# Patient Record
Sex: Female | Born: 1950 | Race: Black or African American | Hispanic: No | Marital: Married | Smoking: Never smoker
Health system: Southern US, Community
[De-identification: ages and names within clinical notes are randomized; demographics above are authoritative.]

## PROBLEM LIST (undated history)

## (undated) DIAGNOSIS — E119 Type 2 diabetes mellitus without complications: Secondary | ICD-10-CM

## (undated) DIAGNOSIS — D649 Anemia, unspecified: Secondary | ICD-10-CM

## (undated) DIAGNOSIS — N189 Chronic kidney disease, unspecified: Secondary | ICD-10-CM

## (undated) DIAGNOSIS — I10 Essential (primary) hypertension: Secondary | ICD-10-CM

## (undated) HISTORY — PX: EYE SURGERY: SHX253

## (undated) HISTORY — DX: Chronic kidney disease, unspecified: N18.9

## (undated) HISTORY — DX: Anemia, unspecified: D64.9

## (undated) HISTORY — DX: Type 2 diabetes mellitus without complications: E11.9

---

## 1998-12-18 ENCOUNTER — Other Ambulatory Visit: Admission: RE | Admit: 1998-12-18 | Discharge: 1998-12-18 | Payer: Self-pay | Admitting: *Deleted

## 1999-10-25 ENCOUNTER — Ambulatory Visit (HOSPITAL_COMMUNITY): Admission: RE | Admit: 1999-10-25 | Discharge: 1999-10-25 | Payer: Self-pay | Admitting: Gastroenterology

## 1999-12-31 ENCOUNTER — Other Ambulatory Visit: Admission: RE | Admit: 1999-12-31 | Discharge: 1999-12-31 | Payer: Self-pay | Admitting: *Deleted

## 2003-04-12 ENCOUNTER — Ambulatory Visit (HOSPITAL_COMMUNITY): Admission: RE | Admit: 2003-04-12 | Discharge: 2003-04-13 | Payer: Self-pay | Admitting: Ophthalmology

## 2003-07-31 ENCOUNTER — Ambulatory Visit (HOSPITAL_COMMUNITY): Admission: RE | Admit: 2003-07-31 | Discharge: 2003-07-31 | Payer: Self-pay | Admitting: Ophthalmology

## 2003-12-08 ENCOUNTER — Ambulatory Visit: Payer: Self-pay | Admitting: Oncology

## 2009-03-23 ENCOUNTER — Ambulatory Visit (HOSPITAL_COMMUNITY): Admission: RE | Admit: 2009-03-23 | Discharge: 2009-03-23 | Payer: Self-pay | Admitting: Nephrology

## 2010-04-24 LAB — GLUCOSE, CAPILLARY
Glucose-Capillary: 125 mg/dL — ABNORMAL HIGH (ref 70–99)
Glucose-Capillary: 88 mg/dL (ref 70–99)

## 2010-04-24 LAB — PROTIME-INR
INR: 0.99 (ref 0.00–1.49)
Prothrombin Time: 13 seconds (ref 11.6–15.2)

## 2010-04-24 LAB — APTT: aPTT: 34 seconds (ref 24–37)

## 2010-04-24 LAB — BASIC METABOLIC PANEL
BUN: 27 mg/dL — ABNORMAL HIGH (ref 6–23)
Sodium: 140 mEq/L (ref 135–145)

## 2010-04-24 LAB — CBC
Hemoglobin: 11.2 g/dL — ABNORMAL LOW (ref 12.0–15.0)
MCHC: 33.1 g/dL (ref 30.0–36.0)
MCV: 81.1 fL (ref 78.0–100.0)
RDW: 16.7 % — ABNORMAL HIGH (ref 11.5–15.5)

## 2010-06-21 NOTE — Procedures (Signed)
[place]. [facility]  Patient:    Hannah [name], Hannah [name]                     MRN: [id number] Proc. Date: 10/25/99 Adm. Date:  [id number] Disc. Date: [id number] Attending:  Ernie Avena CC:         Jaynie Crumble, M.D.   Procedure Report  PROCEDURE:  Colonoscopy.  INDICATIONS:  A 60 year old female with anemia, hemoglobin 9.7 and MCV 81, with negative upper endoscopy.  FINDINGS:  Normal exam.  DESCRIPTION OF PROCEDURE:  The nature, purpose, and risks of the procedure had been discussed with the patient, who provided written consent.  Sedation was fentanyl 75 mcg and Versed 4 mg IV prior to and during this procedure and the upper endoscopy which preceded.  The Olympus PCF 140L pediatric extended length video colonoscope was advanced quite easily to the cecum as identified by clear visualization of the appendiceal orifice and the absence of further lumen.  Pullback was then performed.  I could not readily identify and enter the orifice of the ileocecal valve.  The quality of the prep was very good and it was felt that all areas were well seen.  This was an unremarkable examination.  No polyps, cancer, colitis, vascular malformations, or diverticular disease were observed and retroflexion in the rectum was unremarkable.  No biopsies were obtained.  The patient tolerated this procedure well and there were no apparent complications.  IMPRESSION:  Normal colonoscopy.  No source of anemia endoscopically evident.  PLAN: 1. Check iron studies and possibly repeat hemoglobin. 2. Home Hemoccults (the patient was Hemoccult negative in the office). DD:  10/25/99 TD:  10/27/99 Job: [id number]

## 2010-06-21 NOTE — Procedures (Signed)
[place]. [facility]  Patient:    Hannah [name], Hannah [name]                     MRN: [id number] Proc. Date: 10/25/99 Adm. Date:  [id number] Attending:  Ernie Avena CC:         Jaynie Crumble, M.D., [place], [place]   Procedure Report  PROCEDURE:  Upper endoscopy.  ENDOSCOPIST:  Cleotis Nipper, M.D.  INDICATIONS:  [age]-year-old female with long-standing NSAID use who presented with anemia, hemoglobin 9.7, MCV 81.  FINDINGS:  Normal exam.  No NSAID gastropathy.  INFORMED CONSENT:  The nature, purpose and risks of the procedure had been discussed with the patient who provided written consent.  SEDATION:  Fentanyl 50 mcg and Versed 4 mg IV without arrhythmias or desaturation.  DESCRIPTION OF PROCEDURE:  The Olympus video endoscope was passed easily under direct vision.  The vocal cords were not well seen.  The esophagus was endoscopically normal without evidence of reflux esophagitis, Barretts esophagus, varices, infection or neoplasia.  No ring stricture or significant hiatal hernia was appreciated.  The stomach was entered.  It contained no significant residual and had normal mucosa without evidence of gastritis, erosions, ulcers, polyps or masses including a retroflexed view of the proximal stomach.  The pylorus, duodenal bulb and second duodenum looked normal.  No biopsies were obtained.  The patient tolerated the procedure well.  There were no apparent complications.  IMPRESSION:  Normal upper endoscopy without source of anemia endoscopically normal.  PLAN:  Proceed to colonoscopic evaluation. DD:  10/25/99 TD:  10/27/99 Job: [id number]

## 2010-06-21 NOTE — Op Note (Signed)
[name], [name] NO.:  [id number]   MEDICAL RECORD NO.:  [id number]                   PATIENT TYPE:  OIB   LOCATION:  2899                                 FACILITY:  [facility]   PHYSICIAN:  Clent Demark. Rankin, M.D.                DATE OF BIRTH:  Oct 08, 1950   DATE OF PROCEDURE:  07/31/2003  DATE OF DISCHARGE:  07/31/2003                                 OPERATIVE REPORT   PREOPERATIVE DIAGNOSES:  1. Dense vitreous hemorrhage, OD.  2. Traction retinal detachment, macula, OD, progressive despite previous     panphotocoagulation.  3. Proliferative diabetic retinopathy.  4. Nuclear sclerotic cataract, OD.   POSTOPERATIVE DIAGNOSES:  1. Dense vitreous hemorrhage, OD.  2. Traction retinal detachment, macula, OD, progressive despite previous     panphotocoagulation.  3. Proliferative diabetic retinopathy.  4. Nuclear sclerotic cataract, OD.   PROCEDURES:  1. Posterior vitrectomy with membrane peel, OD.  2. Endolaser panphotocoagulation, OD.  3. Phacoemulsification, extracapsular cataract extraction and removal from     the right eye with insertion of posterior chamber intraocular lens     implant, OD, in the sulcus.  4. Injection of vitreous substitute, SF6 20%, to reattach the retina, OD.   SURGEON:  Clent Demark. Rankin, M.D.   ANESTHESIA:  Local retrobulbar with monitored anesthesia control.   INDICATION FOR PROCEDURE:  The patient is a 60 year old woman with advanced  diabetic retinopathy (previous panphotocoagulation) with combined traction  detachment of the retina, macular region, and significant epiretinal  proliferations over the optic nerve.  This is an attempt to reattach the  retina.  The patient understands the risks of anesthesia, including the rare  occurrence of death, and losses to the eye, including but not limited to  hemorrhage, infection, scarring, need for further surgery, no change in  vision, loss of vision, and progressive disease  despite intervention.   Appropriate signed consent was obtained.  She was taken to the operating  room.  In the operating room appropriate monitors were followed by mild  sedation.  Marcaine 0.75% delivered 5 mL retrobulbar followed by an  additional 5 mL laterally in the fashion of modified Kirk Ruths.  The right  periocular region was sterilely prepped and draped in the usual ophthalmic  fashion.  A lid speculum applied.  Conjunctival peritomy was then fashioned  superiorly and inferotemporally.  A 4 mm infusion was secured 4 mm posterior  to the limbus in the inferotemporal quadrant.  Placement in the vitreous  cavity verified visually.  A superior sclerotomy was then fashioned.  Wall  microscope was placed in position with BIOM attached.  A core vitrectomy was  then done.  Iatrogenic posterior vitreous detachment and removal of  posterior hyaloid in modified delaminating fashion was then initiated;  however, this was difficult because of the cataract.  The posterior capsule  was entered.  Hydrodelineation and delamination was then carried out in  the  bag.  Phacoemulsification carried out in the bag.  After the cataract was  removed without clear delineation of the posterior removal,  the posterior  hyaloid and the fibrovascular tissues on the macular region were clearly  identifiable without difficulty and removed.  At this time hemostasis was  spontaneous.  Endolaser photocoagulation was then placed 360 degrees.  At  this time the limbal incision was then fashioned superiorly.  The model  [id number] [employer] power +22.5, serial number [id number], placed into the  sulcus and then directed in a horizontal position and repositioned and  excellent capsule support was noted 360 degrees.  The limbal incision was  closed with interrupted 10-0 nylon suture and found to be watertight and  secure.  Central posterior capsular opening was then fashioned.  A fluid-air  exchange was then completed.   Under air further laser photocoagulation was  placed peripherally.  Superior sclerotomies were then fashioned after an air-  SF6 20% exchange completed.  Conjunctiva closed with 7-0 Vicryl.  Subconjunctival injection of antibiotic and steroid applied.  The infusion  had also been removed as well and similarly closed.  A sterile patch and Fox  shield applied to the right eye.  It must be noted that the anterior chamber  had been deepened with Provisc prior to the insertion of the intraocular  lens, and this had also been aspirated freely from the anterior chamber.   At this time a sterile patch and Fox shield had been applied to the right  eye.  The patient was taken to the short stay area in good and stable  condition and discharged home as an outpatient.                                               Clent Demark Rankin, M.D.    GAR/MEDQ  D:  07/31/2003  T:  08/01/2003  Job:  [id number]

## 2010-06-21 NOTE — Op Note (Signed)
[name], Hannah [name] NO.:  [id number]   MEDICAL RECORD NO.:  [id number]                   PATIENT TYPE:  OIB   LOCATION:  [id number]                                 FACILITY:  [facility]   PHYSICIAN:  Clent Demark. Rankin, M.D.                DATE OF BIRTH:  1950-04-06   DATE OF PROCEDURE:  04/12/2003  DATE OF DISCHARGE:                                 OPERATIVE REPORT   PREOPERATIVE DIAGNOSIS:  1. Dense vitreous hemorrhage of the left eye - non clearing.  2. Traction detachment, left eye.  3. Progressive proliferative diabetic retinopathy, left eye.   POSTOPERATIVE DIAGNOSIS:  1. Dense vitreous hemorrhage of the left eye - non clearing.  2. Traction detachment, left eye.  3. Progressive proliferative diabetic retinopathy, left eye.   PROCEDURE:  1. Posterior vitrectomy with membrane peel, left eye.  2. Panphotocoagulation endolaser, left eye.   SURGEON:  Clent Demark. Rankin, M.D.   ANESTHESIA:  General endotracheal anesthesia.   INDICATIONS FOR PROCEDURE:  The patient is a 60 year old woman with profound  vision loss of the left eye on the basis of progressive proliferative  diabetic retinopathy leading to proliferations, traction detachment  superotemporal arcade, vitreopapillary traction, and traction along the  inferotemporal arcade threatening macular or foveal detachment.  This is an  attempt to release the traction, clear the vitreous hemorrhage, induce lysis  of retinopathy and improve the patient's ambulatory status and return to  functional status as soon as possible.  The patient understands the risks of  anesthesia and surgery including but not limited to hemorrhage, infection,  scarring, need for further surgery, no change in vision, loss of vision,  progression of disease despite intervention.  Appropriate signed consent was  obtained.  She was taken to the operating room.   DESCRIPTION OF PROCEDURE:  In the operating room, general endotracheal  anesthesia was instituted without difficulty.  The left periocular region  was sterilely prepped and draped in the usual ophthalmic fashion.  A lid  speculum was applied.  A conjunctival peritomy was then fashioned temporally  and superonasal.  The 4 mm infusion was secured 4 mm posterior to the limbus  in the inferotemporal quadrant.  Placement in the vitreous cavity was  verified visually.  A superior sclerotomy was then fashioned.  The operating  microscope was placed into position with BIOM attached.  A core vitrectomy  was then begun.  Iatrogenic posterior vitreous detachment was necessary  using modified en bloc technique to release the tractional portions of the  macular region and the optic nerve.  Hemostasis was spontaneous.  Peripheral  vitreous was then trimmed 360 degrees.  Endolaser photocoagulation was  placed for 360 degrees.  No complications occurred.  The superior sclerotomy  was closed  with 7-0 Vicryl.  The infusion was removed and sclerotomy closed with 7-0  Vicryl.  The conjunctiva was closed with 7-0 Vicryl.  Subconjunctival  injection  of antibiotics were applied.  The patient tolerated the procedure  well without complications.                                               Clent Demark Rankin, M.D.    GAR/MEDQ  D:  04/12/2003  T:  04/13/2003  Job:  [id number]   cc:   Dianah Field, M.D.

## 2011-09-14 IMAGING — US IR ANGIO/RENAL BILAT WO/W FLUSH
1 series · 1 of 1 positions shown · non-contrast
Comparison: None.

CLINICAL DATA: Hypertension.  Renal insufficiency.

BILATERAL RENAL ARTERIOGRAPHY,ULTRASOUND VENOUS ACCESS

[Series 1: ir angio/renal bilat wo/w flush · 1 of 1 slices shown]
[im 1/1]
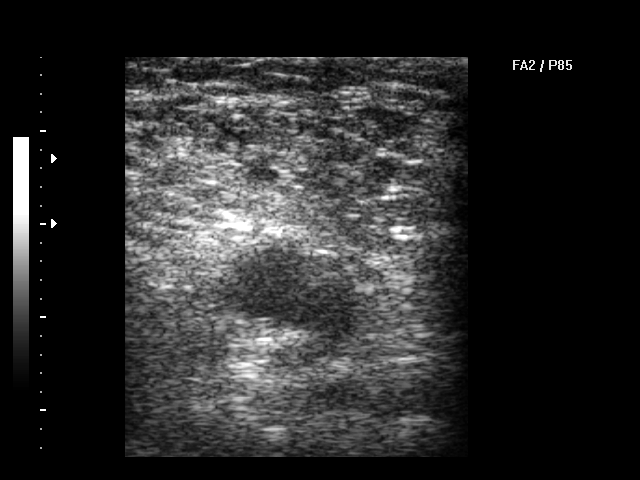

[1 of 1 positions shown; findings below may reference images not displayed]

Approach:  The right common femoral artery

Vessels catheterized:  Left renal artery, right renal artery

Procedure:  The procedure, risks, benefits, and alternatives were
explained to the patient.  Questions regarding the procedure were
encouraged and answered.  The patient understands and consents to
the procedure.

The right femoral region was prepped with betadine and in a sterile
fashion, and a sterile drape was applied covering the operative
field.  A sterile gown and sterile gloves were used for the
procedure. Intravenous Fentanyl and Versed were administered as
conscious sedation during continuous cardiorespiratory monitoring
by the radiology RN, with a total moderate sedation time of 20
minutes. Local anesthesia was provided with 1% Lidocaine.
Ultrasound image documentation was performed.  Fluoroscopy during
the procedure and fluoro spot radiograph confirms appropriate
catheter position.

The right common femoral artery was accessed with a 21-gauge
micropuncture needle under real time ultrasound guidance.  Needle
exchanged over zero and a wire for a transitional dilator which
allowed passage of a Benson wire into the abdominal aorta.  A 5-
French vascular sheath was placed.  Through this, a 5-French
pigtail catheter was advanced into the proximal abdominal aorta for
flush aortography.  The pigtail catheter was exchanged for a 5-
French sauce catheter, used to selectively catheterize the right
and the left renal arteries sequentially for selective renal
arteriography in multiple projections.  The catheter was then
removed over guide wire.  The sheath was removed and hemostasis
achieved. The patient tolerated the procedure well. No immediate
complication
FINDINGS: Flush abdominal aortography shows no significant
atheromatous irregularity, dissection, or stenosis of the abdominal
aorta.  Single renal arteries are identified bilaterally.

Selective left renal arteriography in multiple projections
demonstrates no stenosis, web, beading, or other lesion. Normal
renal parenchymal and venous phases.

Selective right renal arteriography in multiple projections
demonstrates no stenosis, web, beading, or other lesion. Normal
renal parenchymal and venous phases.
IMPRESSION: Negative selective bilateral renal arteriogram.

## 2011-09-14 IMAGING — XA IR ANGIO/RENAL BILAT WO/W FLUSH
1 series · 12 of 22 positions shown · non-contrast
Comparison: None.

CLINICAL DATA: Hypertension.  Renal insufficiency.

BILATERAL RENAL ARTERIOGRAPHY,ULTRASOUND VENOUS ACCESS

[Series 1: run · 12 of 22 slices shown]
[im 1/22]
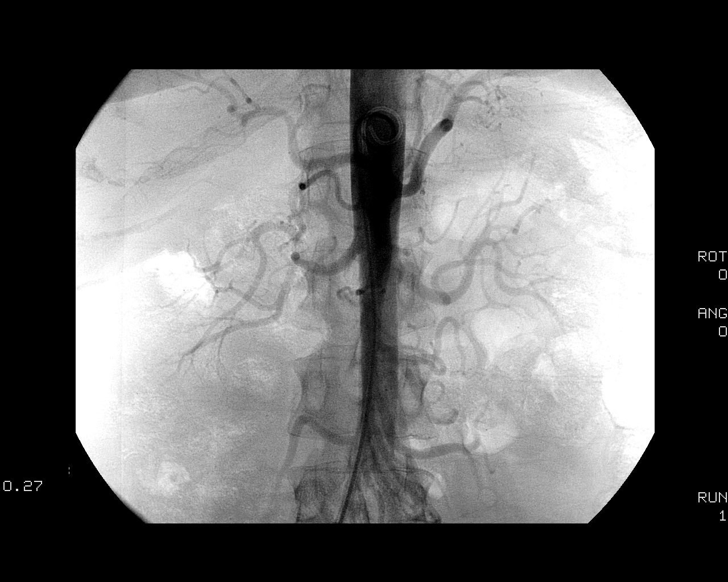
[im 3/22]
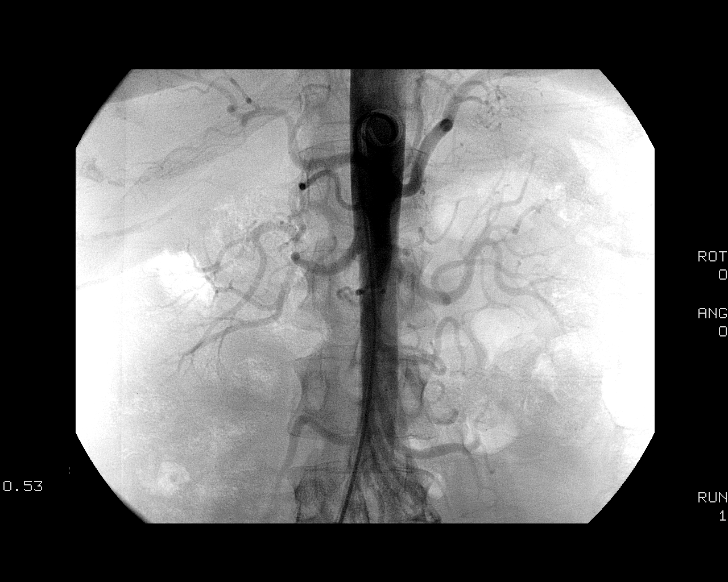
[im 5/22]
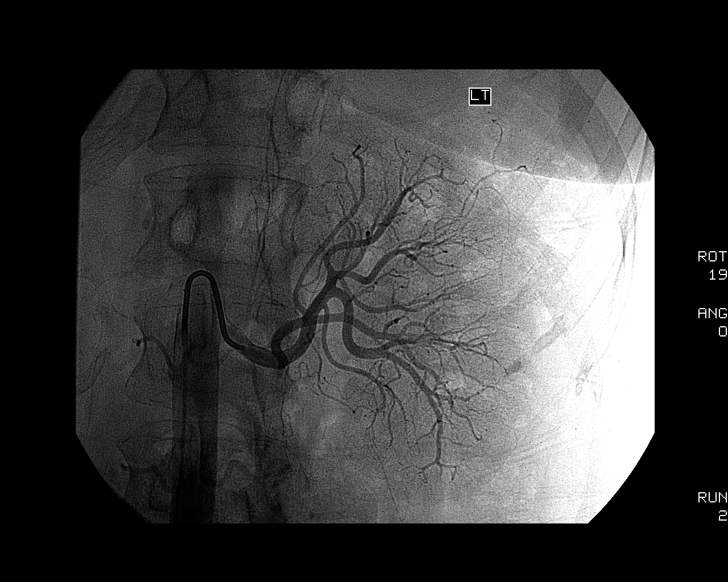
[im 7/22]
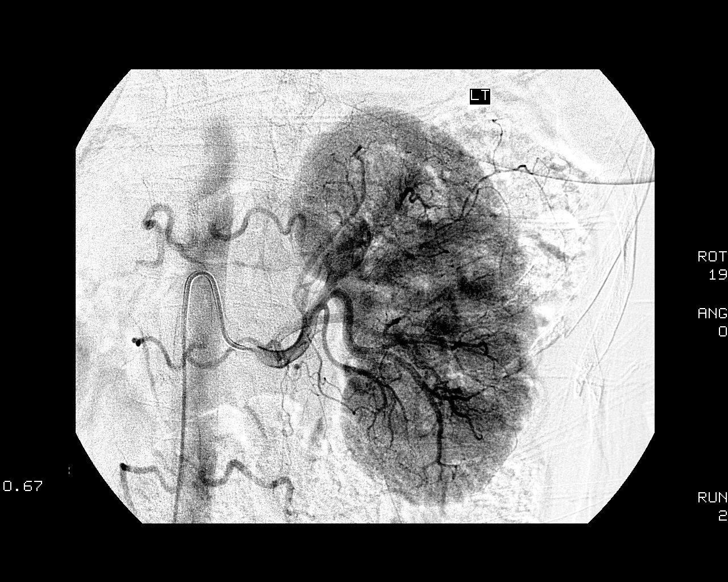
[im 9/22]
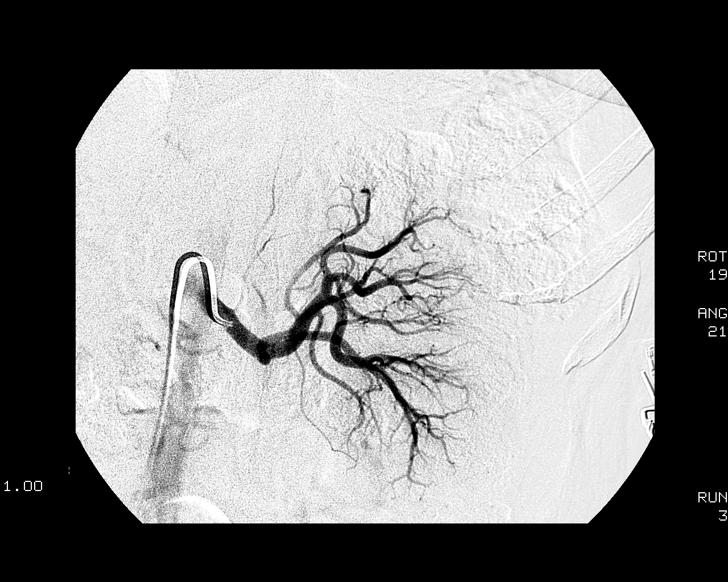
[im 11/22]
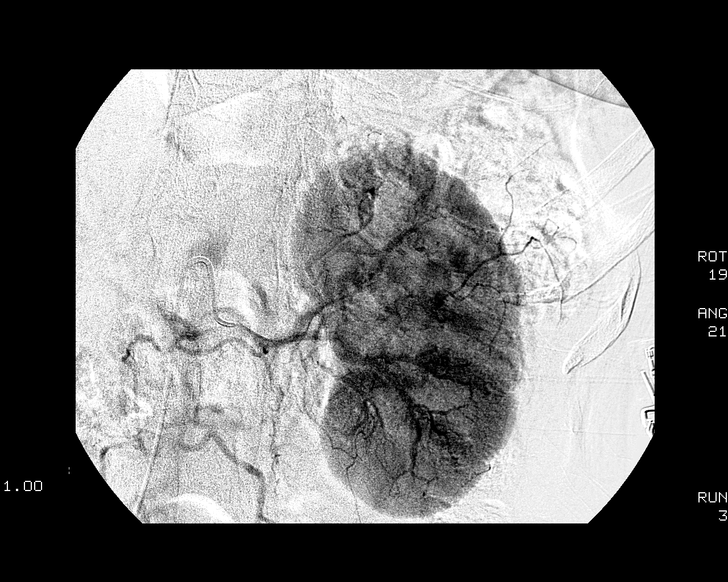
[im 12/22]
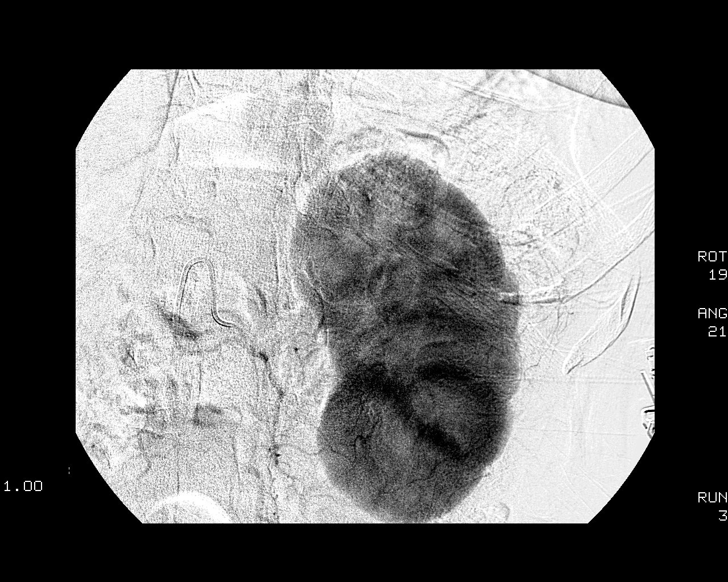
[im 14/22]
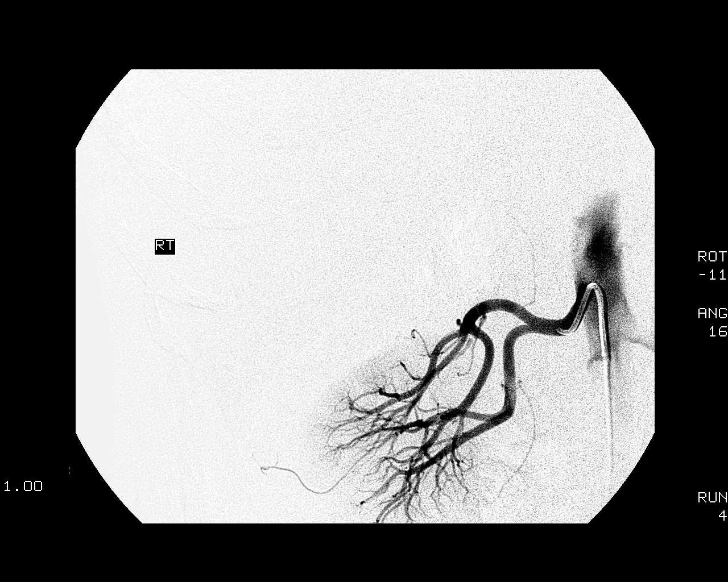
[im 16/22]
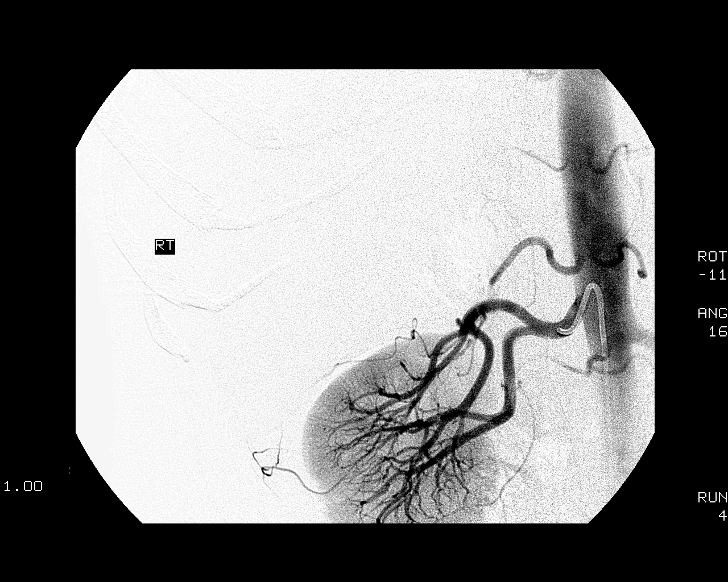
[im 18/22]
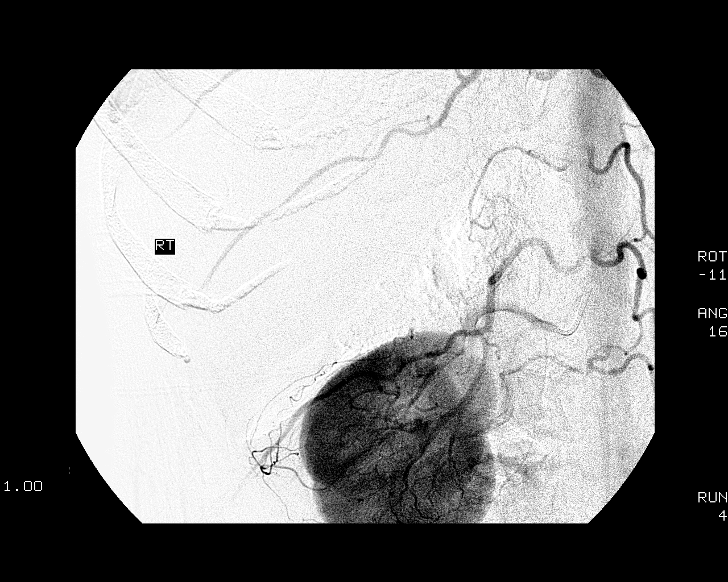
[im 20/22]
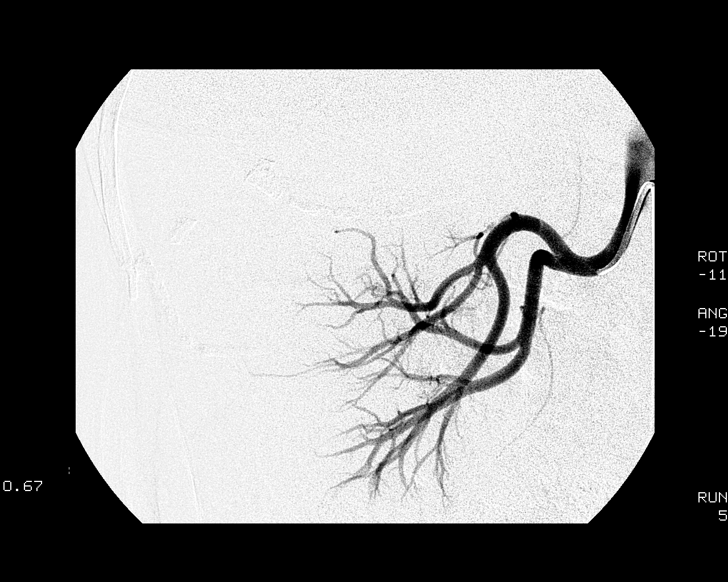
[im 22/22]
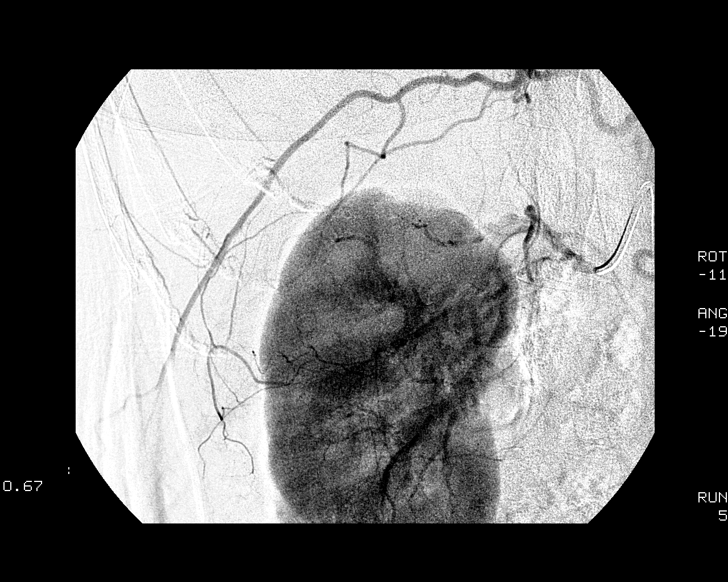

[12 of 22 positions shown; findings below may reference images not displayed]

Approach:  The right common femoral artery

Vessels catheterized:  Left renal artery, right renal artery

Procedure:  The procedure, risks, benefits, and alternatives were
explained to the patient.  Questions regarding the procedure were
encouraged and answered.  The patient understands and consents to
the procedure.

The right femoral region was prepped with betadine and in a sterile
fashion, and a sterile drape was applied covering the operative
field.  A sterile gown and sterile gloves were used for the
procedure. Intravenous Fentanyl and Versed were administered as
conscious sedation during continuous cardiorespiratory monitoring
by the radiology RN, with a total moderate sedation time of 20
minutes. Local anesthesia was provided with 1% Lidocaine.
Ultrasound image documentation was performed.  Fluoroscopy during
the procedure and fluoro spot radiograph confirms appropriate
catheter position.

The right common femoral artery was accessed with a 21-gauge
micropuncture needle under real time ultrasound guidance.  Needle
exchanged over zero and a wire for a transitional dilator which
allowed passage of a Benson wire into the abdominal aorta.  A 5-
French vascular sheath was placed.  Through this, a 5-French
pigtail catheter was advanced into the proximal abdominal aorta for
flush aortography.  The pigtail catheter was exchanged for a 5-
French sauce catheter, used to selectively catheterize the right
and the left renal arteries sequentially for selective renal
arteriography in multiple projections.  The catheter was then
removed over guide wire.  The sheath was removed and hemostasis
achieved. The patient tolerated the procedure well. No immediate
complication
FINDINGS: Flush abdominal aortography shows no significant
atheromatous irregularity, dissection, or stenosis of the abdominal
aorta.  Single renal arteries are identified bilaterally.

Selective left renal arteriography in multiple projections
demonstrates no stenosis, web, beading, or other lesion. Normal
renal parenchymal and venous phases.

Selective right renal arteriography in multiple projections
demonstrates no stenosis, web, beading, or other lesion. Normal
renal parenchymal and venous phases.
IMPRESSION: Negative selective bilateral renal arteriogram.

## 2014-03-22 ENCOUNTER — Encounter: Payer: Self-pay | Admitting: Vascular Surgery

## 2014-03-22 ENCOUNTER — Other Ambulatory Visit: Payer: Self-pay | Admitting: *Deleted

## 2014-03-22 DIAGNOSIS — Z01810 Encounter for preprocedural cardiovascular examination: Secondary | ICD-10-CM

## 2014-03-22 DIAGNOSIS — N184 Chronic kidney disease, stage 4 (severe): Secondary | ICD-10-CM

## 2014-03-24 ENCOUNTER — Other Ambulatory Visit: Payer: Self-pay | Admitting: *Deleted

## 2014-04-25 ENCOUNTER — Encounter: Payer: Self-pay | Admitting: Vascular Surgery

## 2014-04-26 ENCOUNTER — Ambulatory Visit: Payer: Self-pay | Admitting: Vascular Surgery

## 2014-04-26 ENCOUNTER — Ambulatory Visit (HOSPITAL_COMMUNITY): Payer: Self-pay

## 2014-05-09 ENCOUNTER — Encounter: Payer: Self-pay | Admitting: Vascular Surgery

## 2014-05-10 ENCOUNTER — Ambulatory Visit (HOSPITAL_COMMUNITY)
Admission: RE | Admit: 2014-05-10 | Discharge: 2014-05-10 | Disposition: A | Discharge status: Home / Self Care | Payer: 59 | Source: Ambulatory Visit | Attending: Vascular Surgery | Admitting: Vascular Surgery

## 2014-05-10 ENCOUNTER — Encounter: Payer: Self-pay | Admitting: Vascular Surgery

## 2014-05-10 ENCOUNTER — Ambulatory Visit (INDEPENDENT_AMBULATORY_CARE_PROVIDER_SITE_OTHER): Payer: 59 | Admitting: Vascular Surgery

## 2014-05-10 ENCOUNTER — Ambulatory Visit (INDEPENDENT_AMBULATORY_CARE_PROVIDER_SITE_OTHER)
Admission: RE | Admit: 2014-05-10 | Discharge: 2014-05-10 | Disposition: A | Discharge status: Home / Self Care | Payer: 59 | Source: Ambulatory Visit | Attending: Vascular Surgery | Admitting: Vascular Surgery

## 2014-05-10 VITALS — BP 108/59 | HR 84 | Ht 66.0 in | Wt 174.0 lb

## 2014-05-10 DIAGNOSIS — Z01810 Encounter for preprocedural cardiovascular examination: Secondary | ICD-10-CM

## 2014-05-10 DIAGNOSIS — N184 Chronic kidney disease, stage 4 (severe): Secondary | ICD-10-CM | POA: Diagnosis not present

## 2014-05-10 NOTE — Progress Notes (Signed)
[facility]  Patient name: Hannah [name] MRN: [id number] DOB: 01/17/1951 Sex: female  REASON FOR CONSULT: evaluate for new hemodialysis access.  HPI: Hannah [name] is a 64 y.o. female who is not yet on dialysis. She was sent for evaluation for new access. I have reviewed her records from Dr. Etheleen Nicks office. 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 She has an insulin pump. She denies any recent uremic symptoms. Specifically she denies nausea, vomiting, fatigue, anorexia, or palpitations.  She is right-handed.   Past Medical History  Diagnosis Date  . Diabetes mellitus without complication   . Anemia    Family History  Problem Relation Age of Onset  . Diabetes Mother   . Heart disease Mother   . Hypertension Mother   . Diabetes Sister   . Heart disease Sister   . Hypertension Sister   . Hypertension Brother    SOCIAL HISTORY: History  Substance Use Topics  . Smoking status: Never Smoker   . Smokeless tobacco: Never Used  . Alcohol Use: No   No Known Allergies No current outpatient prescriptions on file.   No current facility-administered medications for this visit.   REVIEW OF SYSTEMS: Valu.Nieves ] denotes positive finding; [  ] denotes negative finding  CARDIOVASCULAR:  [ ]  chest pain   [ ]  chest pressure   [ ]  palpitations   [ ]  orthopnea   [ ]  dyspnea on exertion   [ ]  claudication   [ ]  rest pain   [ ]  DVT   [ ]  phlebitis PULMONARY:   [ ]  productive cough   [ ]  asthma   [ ]  wheezing NEUROLOGIC:   [ ]  weakness  [ ]  paresthesias  [ ]  aphasia  [ ]  amaurosis  [ ]  dizziness HEMATOLOGIC:   [ ]  bleeding problems   [ ]  clotting disorders MUSCULOSKELETAL:  [ ]  joint pain   [ ]  joint swelling [ ]  leg swelling GASTROINTESTINAL: [ ]   blood in stool  [ ]   hematemesis GENITOURINARY:  [ ]   dysuria  [ ]   hematuria PSYCHIATRIC:  [ ]  history of major depression INTEGUMENTARY:  [ ]  rashes  [ ]   ulcers CONSTITUTIONAL:  [ ]  fever   [ ]  chills  PHYSICAL EXAM: Filed Vitals:   05/10/14 1143  BP: 108/59  Pulse: 84  Height: 5\' 6"  (1.676 m)  Weight: 174 lb (78.926 kg)  SpO2: 98%   GENERAL: The patient is a well-nourished female, in no acute distress. The vital signs are documented above. CARDIOVASCULAR: There is a regular rate and rhythm. I do not carotid bruits. She has palpable brachial and radial pulses bilaterally. I do not see any adequate superficial veins on exam. PULMONARY: There is good air exchange bilaterally without wheezing or rales. ABDOMEN: Soft and non-tender with normal pitched bowel sounds.  MUSCULOSKELETAL: There are no major deformities or cyanosis. NEUROLOGIC: No focal weakness or paresthesias are detected. SKIN: There are no ulcers or rashes noted. PSYCHIATRIC: The patient has a normal affect.  DATA:  I have independently interpreted her upper extremity vein map. This shows that the forearm and upper arm cephalic vein is in both arms do not appear to be adequate for a fistula. Likewise, the basilic veins in both arms do not appear to be adequate for fistula.  I reviewed her labs from 03/15/2014. This shows that her creatinine was 2.69. Her GFR was 21.  MEDICAL ISSUES:  STAGE IV  CHRONIC KIDNEY DISEASE: Based on her vein map she does not appear to be a candidate for an AV fistula. I discussed the case over the phone today with Dr. Mercy Moore. He feels it would be best to wait on placing new access as she will likely require placement of an AV graft. He will let us know when to proceed. I have discussed the procedure potential complications with her in the office today and she is agreeable to proceed whenever it is necessary. She would not need to be seen back preoperatively and less she had any specific questions.   [facility] [place] Beeper: [phone]

## 2015-03-11 DIAGNOSIS — D631 Anemia in chronic kidney disease: Secondary | ICD-10-CM | POA: Insufficient documentation

## 2015-03-28 DIAGNOSIS — N189 Chronic kidney disease, unspecified: Secondary | ICD-10-CM | POA: Diagnosis not present

## 2015-03-28 DIAGNOSIS — D631 Anemia in chronic kidney disease: Secondary | ICD-10-CM | POA: Diagnosis not present

## 2015-06-25 DIAGNOSIS — N189 Chronic kidney disease, unspecified: Secondary | ICD-10-CM | POA: Diagnosis not present

## 2015-06-25 DIAGNOSIS — D631 Anemia in chronic kidney disease: Secondary | ICD-10-CM | POA: Diagnosis not present

## 2015-06-25 DIAGNOSIS — E119 Type 2 diabetes mellitus without complications: Secondary | ICD-10-CM | POA: Diagnosis not present

## 2015-06-25 DIAGNOSIS — I10 Essential (primary) hypertension: Secondary | ICD-10-CM | POA: Diagnosis not present

## 2015-08-24 DIAGNOSIS — E119 Type 2 diabetes mellitus without complications: Secondary | ICD-10-CM | POA: Diagnosis not present

## 2015-08-24 DIAGNOSIS — I10 Essential (primary) hypertension: Secondary | ICD-10-CM | POA: Diagnosis not present

## 2015-08-24 DIAGNOSIS — D631 Anemia in chronic kidney disease: Secondary | ICD-10-CM | POA: Diagnosis not present

## 2015-08-24 DIAGNOSIS — N189 Chronic kidney disease, unspecified: Secondary | ICD-10-CM | POA: Diagnosis not present

## 2015-11-26 DIAGNOSIS — D631 Anemia in chronic kidney disease: Secondary | ICD-10-CM | POA: Diagnosis not present

## 2015-11-26 DIAGNOSIS — N189 Chronic kidney disease, unspecified: Secondary | ICD-10-CM | POA: Diagnosis not present

## 2016-02-26 DIAGNOSIS — D631 Anemia in chronic kidney disease: Secondary | ICD-10-CM

## 2016-02-26 DIAGNOSIS — N189 Chronic kidney disease, unspecified: Secondary | ICD-10-CM | POA: Diagnosis not present

## 2016-05-26 DIAGNOSIS — E119 Type 2 diabetes mellitus without complications: Secondary | ICD-10-CM | POA: Diagnosis not present

## 2016-05-26 DIAGNOSIS — D631 Anemia in chronic kidney disease: Secondary | ICD-10-CM | POA: Diagnosis not present

## 2016-05-26 DIAGNOSIS — N189 Chronic kidney disease, unspecified: Secondary | ICD-10-CM | POA: Diagnosis not present

## 2016-05-26 DIAGNOSIS — I10 Essential (primary) hypertension: Secondary | ICD-10-CM | POA: Diagnosis not present

## 2016-12-04 DIAGNOSIS — D631 Anemia in chronic kidney disease: Secondary | ICD-10-CM | POA: Diagnosis not present

## 2016-12-04 DIAGNOSIS — N189 Chronic kidney disease, unspecified: Secondary | ICD-10-CM | POA: Diagnosis not present

## 2017-03-06 DIAGNOSIS — N189 Chronic kidney disease, unspecified: Secondary | ICD-10-CM

## 2017-03-06 DIAGNOSIS — D631 Anemia in chronic kidney disease: Secondary | ICD-10-CM | POA: Diagnosis not present

## 2017-06-03 DIAGNOSIS — N189 Chronic kidney disease, unspecified: Secondary | ICD-10-CM

## 2017-06-03 DIAGNOSIS — D631 Anemia in chronic kidney disease: Secondary | ICD-10-CM | POA: Diagnosis not present

## 2017-10-07 DIAGNOSIS — D631 Anemia in chronic kidney disease: Secondary | ICD-10-CM | POA: Diagnosis not present

## 2017-10-07 DIAGNOSIS — N189 Chronic kidney disease, unspecified: Secondary | ICD-10-CM | POA: Diagnosis not present

## 2018-06-07 DIAGNOSIS — N189 Chronic kidney disease, unspecified: Secondary | ICD-10-CM

## 2018-06-07 DIAGNOSIS — D631 Anemia in chronic kidney disease: Secondary | ICD-10-CM

## 2018-09-07 DIAGNOSIS — D649 Anemia, unspecified: Secondary | ICD-10-CM | POA: Diagnosis not present

## 2018-12-08 DIAGNOSIS — D649 Anemia, unspecified: Secondary | ICD-10-CM | POA: Diagnosis not present

## 2019-03-10 DIAGNOSIS — D649 Anemia, unspecified: Secondary | ICD-10-CM | POA: Diagnosis not present

## 2019-07-27 DIAGNOSIS — D649 Anemia, unspecified: Secondary | ICD-10-CM | POA: Diagnosis not present

## 2019-11-15 ENCOUNTER — Telehealth: Payer: Self-pay | Admitting: Oncology

## 2019-11-15 NOTE — Telephone Encounter (Signed)
Rescheduled injection appt from 10/22 in mosaiq to 10/25 in epic in order to give labs time to result. Left voicemail with appt date, time, and direct number for pt to call back if pt is unable to keep appt.

## 2019-11-24 ENCOUNTER — Encounter: Payer: Self-pay | Admitting: Pharmacist

## 2019-11-24 DIAGNOSIS — N185 Chronic kidney disease, stage 5: Secondary | ICD-10-CM

## 2019-11-24 DIAGNOSIS — D631 Anemia in chronic kidney disease: Secondary | ICD-10-CM

## 2019-11-24 DIAGNOSIS — D649 Anemia, unspecified: Secondary | ICD-10-CM | POA: Insufficient documentation

## 2019-11-28 ENCOUNTER — Inpatient Hospital Stay: Payer: Medicare Other

## 2019-11-28 ENCOUNTER — Ambulatory Visit: Payer: Medicare Other

## 2019-11-29 ENCOUNTER — Other Ambulatory Visit: Payer: Self-pay | Admitting: Hematology and Oncology

## 2019-11-29 DIAGNOSIS — D631 Anemia in chronic kidney disease: Secondary | ICD-10-CM

## 2019-11-29 DIAGNOSIS — N189 Chronic kidney disease, unspecified: Secondary | ICD-10-CM

## 2019-12-06 ENCOUNTER — Other Ambulatory Visit: Payer: Self-pay | Admitting: Oncology

## 2019-12-06 DIAGNOSIS — D631 Anemia in chronic kidney disease: Secondary | ICD-10-CM

## 2019-12-06 DIAGNOSIS — N189 Chronic kidney disease, unspecified: Secondary | ICD-10-CM

## 2019-12-06 NOTE — Progress Notes (Signed)
Hannah [name]  [address] [place],  [place]  [address] [phone]  Clinic Day:  12/07/2019  Referring physician: Maris Berger, MD   HISTORY OF PRESENT ILLNESS:  The patient is a 69 y.o. female with anemia secondary to chronic renal insufficiency.  She occasionally requires red cell shot therapy to keep her hemoglobin above 10.  She comes in today for routine follow up.  Since her last visit, the patient has been doing fairly well.  From an anemia standpoint, she denies having increased fatigue or any overt forms of blood loss which concern her for a declining hemoglobin.     PHYSICAL EXAM:  Blood pressure (!) 154/73, pulse 80, temperature 98.3 F (36.8 C), resp. rate 16, height 5\' 6"  (1.676 m), weight 191 lb 6.4 oz (86.8 kg), SpO2 95 %. Wt Readings from Last 3 Encounters:  12/07/19 191 lb 6.4 oz (86.8 kg)  12/06/19 192 lb 3.2 oz (87.2 kg)  05/10/14 174 lb (78.9 kg)   Body mass index is 30.89 kg/m. Performance status (ECOG): 1 - Symptomatic but completely ambulatory Physical Exam Constitutional:      Appearance: Normal appearance.  HENT:     Mouth/Throat:     Pharynx: Oropharynx is clear. No oropharyngeal exudate.  Cardiovascular:     Rate and Rhythm: Normal rate and regular rhythm.     Heart sounds: No murmur heard.  No friction rub. No gallop.   Pulmonary:     Breath sounds: Normal breath sounds.  Abdominal:     General: Bowel sounds are normal. There is no distension.     Palpations: Abdomen is soft. There is no mass.     Tenderness: There is no abdominal tenderness.  Musculoskeletal:        General: No tenderness.     Cervical back: Normal range of motion and neck supple.     Right lower leg: No edema.     Left lower leg: No edema.  Lymphadenopathy:     Cervical: No cervical adenopathy.     Right cervical: No superficial, deep or posterior cervical adenopathy.    Left cervical: No superficial, deep or posterior cervical  adenopathy.     Upper Body:     Right upper body: No supraclavicular or axillary adenopathy.     Left upper body: No supraclavicular or axillary adenopathy.     Lower Body: No right inguinal adenopathy. No left inguinal adenopathy.  Skin:    Coloration: Skin is not jaundiced.     Findings: No lesion or rash.  Neurological:     General: No focal deficit present.     Mental Status: She is alert and oriented to person, place, and time. Mental status is at baseline.  Psychiatric:        Mood and Affect: Mood normal.        Behavior: Behavior normal.        Thought Content: Thought content normal.        Judgment: Judgment normal.     LABS:      ASSESSMENT & PLAN:   Assessment/Plan:  A 69 y.o. female with anemia secondary to chronic renal insufficiency.  As her hemoglobin remains above 10.  Based upon this, red cell shot therapy is not needed today.  The patient understands it remains imperative for her to keep her hypertension and diabetes under tight control to preserve the residual kidney function she has left.  I will continue to follow her CBC every  2 months to ensure there is no precipitous decline in her hemoglobin.  I will see her back in 4 months for repeat clinical assessment.  .The patient understands all the plans discussed today and is in agreement with them.     Gevorg Brum Macarthur Critchley, MD

## 2019-12-07 ENCOUNTER — Other Ambulatory Visit: Payer: Self-pay | Admitting: Oncology

## 2019-12-07 ENCOUNTER — Inpatient Hospital Stay: Payer: Medicare Other | Attending: Oncology

## 2019-12-07 ENCOUNTER — Telehealth: Payer: Self-pay | Admitting: Oncology

## 2019-12-07 ENCOUNTER — Other Ambulatory Visit: Payer: Self-pay

## 2019-12-07 ENCOUNTER — Inpatient Hospital Stay (INDEPENDENT_AMBULATORY_CARE_PROVIDER_SITE_OTHER): Payer: Medicare Other | Admitting: Oncology

## 2019-12-07 ENCOUNTER — Encounter: Payer: Self-pay | Admitting: Oncology

## 2019-12-07 VITALS — BP 154/73 | HR 80 | Temp 98.3°F | Resp 16 | Ht 66.0 in | Wt 191.4 lb

## 2019-12-07 DIAGNOSIS — N189 Chronic kidney disease, unspecified: Secondary | ICD-10-CM

## 2019-12-07 DIAGNOSIS — N289 Disorder of kidney and ureter, unspecified: Secondary | ICD-10-CM | POA: Diagnosis not present

## 2019-12-07 DIAGNOSIS — D631 Anemia in chronic kidney disease: Secondary | ICD-10-CM

## 2019-12-07 LAB — FERRITIN: Ferritin: 1422 ng/mL — ABNORMAL HIGH (ref 11–307)

## 2019-12-07 LAB — IRON AND TIBC
Iron: 76 ug/dL (ref 28–170)
Saturation Ratios: 27 % (ref 10.4–31.8)
TIBC: 278 ug/dL (ref 250–450)
UIBC: 202 ug/dL

## 2019-12-07 NOTE — Telephone Encounter (Signed)
Per 11/3 LOS.APPTS GIVEN TO PATIENT

## 2019-12-08 ENCOUNTER — Inpatient Hospital Stay: Payer: Medicare Other

## 2019-12-09 ENCOUNTER — Ambulatory Visit: Payer: Medicare Other

## 2020-02-04 HISTORY — PX: COLONOSCOPY: SHX174

## 2020-02-06 ENCOUNTER — Inpatient Hospital Stay: Payer: Medicare Other | Attending: Oncology

## 2020-02-23 ENCOUNTER — Telehealth: Payer: Self-pay | Admitting: Oncology

## 2020-02-23 NOTE — Telephone Encounter (Signed)
Patient called to verify next Appt - Not scheduled until March

## 2020-04-05 ENCOUNTER — Other Ambulatory Visit: Payer: Self-pay | Admitting: Oncology

## 2020-04-05 DIAGNOSIS — N189 Chronic kidney disease, unspecified: Secondary | ICD-10-CM

## 2020-04-05 NOTE — Progress Notes (Signed)
[facility]  [address] [place],  [place]  [address] [phone]  Clinic Day:  04/06/2020  Referring physician: Maris Berger, MD   HISTORY OF PRESENT ILLNESS:  The patient is a 70 y.o. female with anemia secondary to chronic renal insufficiency.  She occasionally requires red cell shot therapy to keep her hemoglobin above 10.  She comes in today for routine follow up.  Since her last visit, the patient has been doing fairly well.  From an anemia standpoint, she denies having increased fatigue or any overt forms of blood loss which concern her for a declining hemoglobin.     PHYSICAL EXAM:  Blood pressure (!) 160/73, pulse 84, temperature 98.4 F (36.9 C), resp. rate 16, height '5\' 6"'$  (1.676 m), weight 194 lb 9.6 oz (88.3 kg), SpO2 96 %. Wt Readings from Last 3 Encounters:  04/11/20 194 lb (88 kg)  04/06/20 194 lb 9.6 oz (88.3 kg)  12/07/19 191 lb 6.4 oz (86.8 kg)   Body mass index is 31.41 kg/m. Performance status (ECOG): 1 - Symptomatic but completely ambulatory Physical Exam Constitutional:      Appearance: Normal appearance.  HENT:     Mouth/Throat:     Pharynx: Oropharynx is clear. No oropharyngeal exudate.  Cardiovascular:     Rate and Rhythm: Normal rate and regular rhythm.     Heart sounds: No murmur heard. No friction rub. No gallop.   Pulmonary:     Breath sounds: Normal breath sounds.  Chest:  Breasts:     Right: No axillary adenopathy or supraclavicular adenopathy.     Left: No axillary adenopathy or supraclavicular adenopathy.    Abdominal:     General: Bowel sounds are normal. There is no distension.     Palpations: Abdomen is soft. There is no mass.     Tenderness: There is no abdominal tenderness.  Musculoskeletal:        General: No tenderness.     Cervical back: Normal range of motion and neck supple.     Right lower leg: No edema.     Left lower leg: No edema.  Lymphadenopathy:     Cervical: No cervical  adenopathy.     Right cervical: No superficial, deep or posterior cervical adenopathy.    Left cervical: No superficial, deep or posterior cervical adenopathy.     Upper Body:     Right upper body: No supraclavicular or axillary adenopathy.     Left upper body: No supraclavicular or axillary adenopathy.     Lower Body: No right inguinal adenopathy. No left inguinal adenopathy.  Skin:    Coloration: Skin is not jaundiced.     Findings: No lesion or rash.  Neurological:     General: No focal deficit present.     Mental Status: She is alert and oriented to person, place, and time. Mental status is at baseline.  Psychiatric:        Mood and Affect: Mood normal.        Behavior: Behavior normal.        Thought Content: Thought content normal.        Judgment: Judgment normal.     LABS:    Ref. Range 04/06/2020 00:00  Sodium Latest Ref Range: 137 - 147  137  Potassium Latest Ref Range: 3.4 - 5.3  4.2  Chloride Latest Ref Range: 99 - 108  105  CO2 Latest Ref Range: 13 - 22  25 (A)  Glucose Unknown  190  BUN Latest Ref Range: 4 - 21  62 (A)  Creatinine Latest Ref Range: 0.5 - 1.1  2.8 (A)  Calcium Latest Ref Range: 8.7 - 10.7  8.0 (A)  Alkaline Phosphatase Latest Ref Range: 25 - 125  138 (A)  Albumin Latest Ref Range: 3.5 - 5.0  3.3 (A)  AST Latest Ref Range: 13 - 35  70 (A)  ALT Latest Ref Range: 7 - 35  41 (A)  Bilirubin, Total Unknown 0.5  Iron Unknown 56  TIBC Unknown 240  %SAT Unknown 23.3  Ferritin Unknown >1,000  WBC Unknown 4.0  RBC Latest Ref Range: 3.87 - 5.11  3.46 (A)  Hemoglobin Latest Ref Range: 12.0 - 16.0  9.4 (A)  HCT Latest Ref Range: 36 - 46  29 (A)  MCV Latest Ref Range: 81 - 99  84  Platelets Latest Ref Range: 150 - 399  105 (A)     ASSESSMENT & PLAN:  Assessment/Plan:  A 70 y.o. female with anemia secondary to chronic renal insufficiency.  As her hemoglobin is below 10, I will arrange for her to receive Retacrit 40,000 units next week.   I will follow her  CBC monthly to ensure there is no precipitous decline in her hemoglobin to where repeat Retacrit doses would be given.  I will see her back in 3 months for repeat clinical assessment. The patient understands all the plans discussed today and is in agreement with them.    Havanna Groner Macarthur Critchley, MD

## 2020-04-06 ENCOUNTER — Other Ambulatory Visit: Payer: Self-pay | Admitting: Hematology and Oncology

## 2020-04-06 ENCOUNTER — Telehealth: Payer: Self-pay | Admitting: Oncology

## 2020-04-06 ENCOUNTER — Other Ambulatory Visit: Payer: Self-pay

## 2020-04-06 ENCOUNTER — Inpatient Hospital Stay: Payer: Medicare Other | Admitting: Oncology

## 2020-04-06 ENCOUNTER — Inpatient Hospital Stay: Payer: Medicare Other | Attending: Oncology

## 2020-04-06 VITALS — BP 160/73 | HR 84 | Temp 98.4°F | Resp 16 | Ht 66.0 in | Wt 194.6 lb

## 2020-04-06 DIAGNOSIS — D631 Anemia in chronic kidney disease: Secondary | ICD-10-CM | POA: Insufficient documentation

## 2020-04-06 DIAGNOSIS — N189 Chronic kidney disease, unspecified: Secondary | ICD-10-CM | POA: Diagnosis not present

## 2020-04-06 LAB — CBC
MCV: 84 (ref 81–99)
RBC: 3.46 — AB (ref 3.87–5.11)

## 2020-04-06 LAB — BASIC METABOLIC PANEL
BUN: 62 — AB (ref 4–21)
CO2: 25 — AB (ref 13–22)
Chloride: 105 (ref 99–108)
Creatinine: 2.8 — AB (ref 0.5–1.1)
Glucose: 190
Potassium: 4.2 (ref 3.4–5.3)
Sodium: 137 (ref 137–147)

## 2020-04-06 LAB — IRON,TIBC AND FERRITIN PANEL
%SAT: 23.3
Ferritin: >1000
Iron: 56
TIBC: 240

## 2020-04-06 LAB — CBC AND DIFFERENTIAL
HCT: 29 — AB (ref 36–46)
Hemoglobin: 9.4 — AB (ref 12.0–16.0)
Neutrophils Absolute: 2.44
Platelets: 105 — AB (ref 150–399)
WBC: 4

## 2020-04-06 LAB — HEPATIC FUNCTION PANEL
ALT: 41 — AB (ref 7–35)
AST: 70 — AB (ref 13–35)
Alkaline Phosphatase: 138 — AB (ref 25–125)
Bilirubin, Total: 0.5

## 2020-04-06 LAB — COMPREHENSIVE METABOLIC PANEL
Albumin: 3.3 — AB (ref 3.5–5.0)
Calcium: 8 — AB (ref 8.7–10.7)

## 2020-04-06 LAB — CORRECTED CALCIUM (CC13): Calcium, Corrected: 8.7

## 2020-04-06 NOTE — Telephone Encounter (Signed)
Per 3/4 los next appt sched and given to patient 

## 2020-04-09 ENCOUNTER — Telehealth: Payer: Self-pay | Admitting: Oncology

## 2020-04-09 NOTE — Telephone Encounter (Signed)
04/09/20 Spoke with patient and sched inj

## 2020-04-11 ENCOUNTER — Inpatient Hospital Stay: Payer: Medicare Other

## 2020-04-11 ENCOUNTER — Other Ambulatory Visit: Payer: Self-pay

## 2020-04-11 VITALS — BP 134/69 | HR 80 | Temp 97.9°F | Resp 18 | Ht 66.0 in | Wt 194.0 lb

## 2020-04-11 DIAGNOSIS — N189 Chronic kidney disease, unspecified: Secondary | ICD-10-CM | POA: Diagnosis not present

## 2020-04-11 DIAGNOSIS — D631 Anemia in chronic kidney disease: Secondary | ICD-10-CM

## 2020-04-11 DIAGNOSIS — N289 Disorder of kidney and ureter, unspecified: Secondary | ICD-10-CM | POA: Diagnosis present

## 2020-04-11 MED ORDER — EPOETIN ALFA-EPBX 40000 UNIT/ML IJ SOLN
40000.0000 [IU] | Freq: Once | INTRAMUSCULAR | Status: AC
  Administered 2020-04-11: 40000 [IU] via SUBCUTANEOUS

## 2020-04-11 MED ORDER — EPOETIN ALFA-EPBX 40000 UNIT/ML IJ SOLN
INTRAMUSCULAR | Status: AC
  Filled 2020-04-11: qty 1

## 2020-04-11 NOTE — Patient Instructions (Signed)

## 2020-05-28 ENCOUNTER — Inpatient Hospital Stay: Payer: Medicare Other | Attending: Oncology

## 2020-05-28 ENCOUNTER — Other Ambulatory Visit: Payer: Self-pay | Admitting: Hematology and Oncology

## 2020-05-28 ENCOUNTER — Telehealth: Payer: Self-pay

## 2020-05-28 ENCOUNTER — Other Ambulatory Visit: Payer: Self-pay

## 2020-05-28 ENCOUNTER — Other Ambulatory Visit: Payer: Self-pay | Admitting: Oncology

## 2020-05-28 ENCOUNTER — Inpatient Hospital Stay: Payer: Medicare Other

## 2020-05-28 VITALS — BP 156/72 | HR 68 | Temp 98.0°F | Resp 18 | Ht 66.0 in | Wt 199.8 lb

## 2020-05-28 DIAGNOSIS — D631 Anemia in chronic kidney disease: Secondary | ICD-10-CM | POA: Diagnosis present

## 2020-05-28 DIAGNOSIS — R0602 Shortness of breath: Secondary | ICD-10-CM

## 2020-05-28 DIAGNOSIS — N189 Chronic kidney disease, unspecified: Secondary | ICD-10-CM | POA: Diagnosis present

## 2020-05-28 DIAGNOSIS — R531 Weakness: Secondary | ICD-10-CM

## 2020-05-28 DIAGNOSIS — R42 Dizziness and giddiness: Secondary | ICD-10-CM

## 2020-05-28 DIAGNOSIS — N185 Chronic kidney disease, stage 5: Secondary | ICD-10-CM

## 2020-05-28 LAB — CBC AND DIFFERENTIAL
HCT: 28 — AB (ref 36–46)
Hemoglobin: 8.9 — AB (ref 12.0–16.0)
Neutrophils Absolute: 1.92
Platelets: 103 — AB (ref 150–399)
WBC: 3.2

## 2020-05-28 LAB — CBC
MCV: 85 (ref 81–99)
RBC: 3.33 — AB (ref 3.87–5.11)

## 2020-05-28 MED ORDER — EPOETIN ALFA-EPBX 40000 UNIT/ML IJ SOLN
INTRAMUSCULAR | Status: AC
  Filled 2020-05-28: qty 1

## 2020-05-28 MED ORDER — EPOETIN ALFA-EPBX 40000 UNIT/ML IJ SOLN
40000.0000 [IU] | Freq: Once | INTRAMUSCULAR | Status: AC
  Administered 2020-05-28: 40000 [IU] via SUBCUTANEOUS

## 2020-05-28 NOTE — Telephone Encounter (Signed)
I called pt. She states, "I feel weak. I can hardly walk from room to room without being short of breath". Pt admits to lightheadedness/dizziness. No falls. Last Retacrit injection over a month ago.  Dr Bobby Rumpf notified, requests pt come in for labs.

## 2020-05-28 NOTE — Patient Instructions (Signed)

## 2020-07-05 NOTE — Progress Notes (Signed)
[facility]  [address] [place],  [place]  [address] [phone]  Clinic Day:  07/06/2020  Referring physician: Maris Berger, MD  This document serves as a record of services personally performed by [name], MD. It was created on their behalf by [facility] E, a trained medical scribe. The creation of this record is based on the scribe's personal observations and the provider's statements to them.  HISTORY OF PRESENT ILLNESS:  The patient is a 70 y.o. female with anemia secondary to chronic renal insufficiency.  She has needed red cell shot therapy over these past few months to get her hemoglobin closer to 10.  She comes in today for routine follow up.  Since her last visit, the patient has been doing fairly well.  From an anemia standpoint, she has had increased fatigue, but denies having any overt forms of blood loss.  PHYSICAL EXAM:  Blood pressure (!) 143/69, pulse 71, temperature 98.2 F (36.8 C), resp. rate 14, height 5' 6"  (1.676 m), weight 179 lb 4.8 oz (81.3 kg), SpO2 92 %. Wt Readings from Last 3 Encounters:  07/06/20 179 lb 4.8 oz (81.3 kg)  05/28/20 199 lb 12 oz (90.6 kg)  04/11/20 194 lb (88 kg)   Body mass index is 28.94 kg/m. Performance status (ECOG): 1 - Symptomatic but completely ambulatory Physical Exam Constitutional:      Appearance: Normal appearance.  HENT:     Mouth/Throat:     Pharynx: Oropharynx is clear. No oropharyngeal exudate.  Cardiovascular:     Rate and Rhythm: Normal rate and regular rhythm.     Heart sounds: No murmur heard. No friction rub. No gallop.   Pulmonary:     Breath sounds: Normal breath sounds.  Chest:  Breasts:     Right: No axillary adenopathy or supraclavicular adenopathy.     Left: No axillary adenopathy or supraclavicular adenopathy.    Abdominal:     General: Bowel sounds are normal. There is no distension.     Palpations: Abdomen is soft. There is no mass.      Tenderness: There is no abdominal tenderness.  Musculoskeletal:        General: No tenderness.     Cervical back: Normal range of motion and neck supple.     Right lower leg: 1+ Edema present.     Left lower leg: 1+ Edema present.  Lymphadenopathy:     Cervical: No cervical adenopathy.     Right cervical: No superficial, deep or posterior cervical adenopathy.    Left cervical: No superficial, deep or posterior cervical adenopathy.     Upper Body:     Right upper body: No supraclavicular or axillary adenopathy.     Left upper body: No supraclavicular or axillary adenopathy.     Lower Body: No right inguinal adenopathy. No left inguinal adenopathy.  Skin:    Coloration: Skin is not jaundiced.     Findings: No lesion or rash.  Neurological:     General: No focal deficit present.     Mental Status: She is alert and oriented to person, place, and time. Mental status is at baseline.  Psychiatric:        Mood and Affect: Mood normal.        Behavior: Behavior normal.        Thought Content: Thought content normal.        Judgment: Judgment normal.     LABS:     ASSESSMENT &  PLAN:  Assessment/Plan:  A 70 y.o. female with anemia secondary to chronic renal insufficiency.  As her hemoglobin is below 10, I will arrange for her to receive Retacrit 40,000 units next week, as well as monthly if her hemoglobin remains below 10.   I will continue to follow her CBC monthly.   Of note, her white cells and platelets have been low as well.  I will see her back in 3 months for repeat clinical assessment.  If her hemoglobin continues to show no ideal response to her Retacrit shots and her other counts remain low, the patient understands a bone marrow biopsy may need to be considered to rule out an intrinsic marrow disorder.  The patient understands all the plans discussed today and is in agreement with them.     I, Rita Ohara, am acting as scribe for Marice Potter, MD    I have reviewed this  report as typed by the medical scribe, and it is complete and accurate.  [name], MD

## 2020-07-06 ENCOUNTER — Other Ambulatory Visit: Payer: Self-pay

## 2020-07-06 ENCOUNTER — Inpatient Hospital Stay: Payer: Medicare Other

## 2020-07-06 ENCOUNTER — Inpatient Hospital Stay: Payer: Medicare Other | Attending: Oncology | Admitting: Oncology

## 2020-07-06 ENCOUNTER — Other Ambulatory Visit: Payer: Self-pay | Admitting: Oncology

## 2020-07-06 ENCOUNTER — Telehealth: Payer: Self-pay | Admitting: Oncology

## 2020-07-06 ENCOUNTER — Encounter: Payer: Self-pay | Admitting: Oncology

## 2020-07-06 VITALS — BP 143/69 | HR 71 | Temp 98.2°F | Resp 14 | Ht 66.0 in | Wt 179.3 lb

## 2020-07-06 DIAGNOSIS — D631 Anemia in chronic kidney disease: Secondary | ICD-10-CM

## 2020-07-06 DIAGNOSIS — N189 Chronic kidney disease, unspecified: Secondary | ICD-10-CM | POA: Diagnosis not present

## 2020-07-06 LAB — CBC: RBC: 3.35 — AB (ref 3.87–5.11)

## 2020-07-06 LAB — CBC AND DIFFERENTIAL
HCT: 28 — AB (ref 36–46)
Hemoglobin: 8.9 — AB (ref 12.0–16.0)
Neutrophils Absolute: 1.71
Platelets: 102 — AB (ref 150–399)
WBC: 3.1

## 2020-07-06 NOTE — Telephone Encounter (Signed)
Per 6/3 LOS, patient scheduled for July-Sept Appt's.  Gave patient Appt Summary

## 2020-07-09 ENCOUNTER — Other Ambulatory Visit: Payer: Self-pay

## 2020-07-09 ENCOUNTER — Inpatient Hospital Stay: Payer: Medicare Other

## 2020-07-09 VITALS — BP 134/70 | HR 80 | Temp 98.7°F | Resp 16 | Ht 66.0 in | Wt 182.1 lb

## 2020-07-09 DIAGNOSIS — D631 Anemia in chronic kidney disease: Secondary | ICD-10-CM

## 2020-07-09 DIAGNOSIS — N189 Chronic kidney disease, unspecified: Secondary | ICD-10-CM | POA: Diagnosis not present

## 2020-07-09 MED ORDER — EPOETIN ALFA-EPBX 40000 UNIT/ML IJ SOLN
INTRAMUSCULAR | Status: AC
  Filled 2020-07-09: qty 1

## 2020-07-09 MED ORDER — EPOETIN ALFA-EPBX 40000 UNIT/ML IJ SOLN
40000.0000 [IU] | Freq: Once | INTRAMUSCULAR | Status: AC
  Administered 2020-07-09: 40000 [IU] via SUBCUTANEOUS

## 2020-07-09 NOTE — Patient Instructions (Signed)

## 2020-07-20 ENCOUNTER — Other Ambulatory Visit: Payer: Medicare Other

## 2020-08-08 ENCOUNTER — Other Ambulatory Visit: Payer: Self-pay | Admitting: Hematology and Oncology

## 2020-08-08 ENCOUNTER — Inpatient Hospital Stay: Payer: Medicare Other

## 2020-08-08 ENCOUNTER — Inpatient Hospital Stay: Payer: Medicare Other | Attending: Oncology

## 2020-08-08 ENCOUNTER — Other Ambulatory Visit: Payer: Self-pay

## 2020-08-08 VITALS — BP 135/72 | HR 80 | Temp 97.9°F | Resp 18 | Ht 66.0 in | Wt 179.2 lb

## 2020-08-08 DIAGNOSIS — D631 Anemia in chronic kidney disease: Secondary | ICD-10-CM

## 2020-08-08 DIAGNOSIS — N189 Chronic kidney disease, unspecified: Secondary | ICD-10-CM | POA: Insufficient documentation

## 2020-08-08 LAB — CBC AND DIFFERENTIAL
HCT: 30 — AB (ref 36–46)
Hemoglobin: 9.6 — AB (ref 12.0–16.0)
Neutrophils Absolute: 1.85
Platelets: 108 — AB (ref 150–399)
WBC: 3.3

## 2020-08-08 LAB — CBC
MCV: 82 (ref 81–99)
RBC: 3.71 — AB (ref 3.87–5.11)

## 2020-08-08 MED ORDER — EPOETIN ALFA-EPBX 40000 UNIT/ML IJ SOLN
INTRAMUSCULAR | Status: AC
  Filled 2020-08-08: qty 1

## 2020-08-08 MED ORDER — EPOETIN ALFA-EPBX 40000 UNIT/ML IJ SOLN
40000.0000 [IU] | Freq: Once | INTRAMUSCULAR | Status: AC
  Administered 2020-08-08: 40000 [IU] via SUBCUTANEOUS

## 2020-08-08 NOTE — Patient Instructions (Signed)
Epoetin Alfa injection What is this medication? EPOETIN ALFA (e POE e tin AL fa) helps your body make more red blood cells. This medicine is used to treat anemia caused by chronic kidney disease, cancer chemotherapy, or HIV-therapy. It may also be used before surgery if you have anemia. This medicine may be used for other purposes; ask your health care provider or pharmacist if you have questions. COMMON BRAND NAME(S): Epogen, Procrit, Retacrit What should I tell my care team before I take this medication? They need to know if you have any of these conditions: cancer heart disease high blood pressure history of blood clots history of stroke low levels of folate, iron, or vitamin B12 in the blood seizures an unusual or allergic reaction to erythropoietin, albumin, benzyl alcohol, hamster proteins, other medicines, foods, dyes, or preservatives pregnant or trying to get pregnant breast-feeding How should I use this medication? This medicine is for injection into a vein or under the skin. It is usually given by a health care professional in a hospital or clinic setting. If you get this medicine at home, you will be taught how to prepare and give this medicine. Use exactly as directed. Take your medicine at regular intervals. Do not take your medicine more often than directed. It is important that you put your used needles and syringes in a special sharps container. Do not put them in a trash can. If you do not have a sharps container, call your pharmacist or healthcare provider to get one. A special MedGuide will be given to you by the pharmacist with each prescription and refill. Be sure to read this information carefully each time. Talk to your pediatrician regarding the use of this medicine in children. While this drug may be prescribed for selected conditions, precautions do apply. Overdosage: If you think you have taken too much of this medicine contact a poison control center or emergency  room at once. NOTE: This medicine is only for you. Do not share this medicine with others. What if I miss a dose? If you miss a dose, take it as soon as you can. If it is almost time for your next dose, take only that dose. Do not take double or extra doses. What may interact with this medication? Interactions have not been studied. This list may not describe all possible interactions. Give your health care provider a list of all the medicines, herbs, non-prescription drugs, or dietary supplements you use. Also tell them if you smoke, drink alcohol, or use illegal drugs. Some items may interact with your medicine. What should I watch for while using this medication? Your condition will be monitored carefully while you are receiving this medicine. You may need blood work done while you are taking this medicine. This medicine may cause a decrease in vitamin B6. You should make sure that you get enough vitamin B6 while you are taking this medicine. Discuss the foods you eat and the vitamins you take with your health care professional. What side effects may I notice from receiving this medication? Side effects that you should report to your doctor or health care professional as soon as possible: allergic reactions like skin rash, itching or hives, swelling of the face, lips, or tongue seizures signs and symptoms of a blood clot such as breathing problems; changes in vision; chest pain; severe, sudden headache; pain, swelling, warmth in the leg; trouble speaking; sudden numbness or weakness of the face, arm or leg signs and symptoms of a stroke like   changes in vision; confusion; trouble speaking or understanding; severe headaches; sudden numbness or weakness of the face, arm or leg; trouble walking; dizziness; loss of balance or coordination Side effects that usually do not require medical attention (report to your doctor or health care professional if they continue or are  bothersome): chills cough dizziness fever headaches joint pain muscle cramps muscle pain nausea, vomiting pain, redness, or irritation at site where injected This list may not describe all possible side effects. Call your doctor for medical advice about side effects. You may report side effects to FDA at 1-800-FDA-1088. Where should I keep my medication? Keep out of the reach of children. Store in a refrigerator between 2 and 8 degrees C (36 and 46 degrees F). Do not freeze or shake. Throw away any unused portion if using a single-dose vial. Multi-dose vials can be kept in the refrigerator for up to 21 days after the initial dose. Throw away unused medicine. NOTE: This sheet is a summary. It may not cover all possible information. If you have questions about this medicine, talk to your doctor, pharmacist, or health care provider.  2022 Elsevier/Gold Standard (2016-08-29 08:35:19)  

## 2020-09-07 ENCOUNTER — Encounter: Payer: Self-pay | Admitting: Oncology

## 2020-09-10 ENCOUNTER — Inpatient Hospital Stay: Payer: Medicare Other | Attending: Oncology

## 2020-09-10 ENCOUNTER — Inpatient Hospital Stay: Payer: Medicare Other

## 2020-09-10 ENCOUNTER — Other Ambulatory Visit: Payer: Self-pay

## 2020-09-10 ENCOUNTER — Other Ambulatory Visit: Payer: Self-pay | Admitting: Hematology and Oncology

## 2020-09-10 VITALS — BP 133/67 | HR 77 | Temp 97.7°F | Resp 18 | Ht 66.0 in | Wt 178.0 lb

## 2020-09-10 DIAGNOSIS — D631 Anemia in chronic kidney disease: Secondary | ICD-10-CM

## 2020-09-10 DIAGNOSIS — N189 Chronic kidney disease, unspecified: Secondary | ICD-10-CM | POA: Insufficient documentation

## 2020-09-10 DIAGNOSIS — N185 Chronic kidney disease, stage 5: Secondary | ICD-10-CM

## 2020-09-10 LAB — CBC AND DIFFERENTIAL
HCT: 29 — AB (ref 36–46)
Hemoglobin: 9.2 — AB (ref 12.0–16.0)
Neutrophils Absolute: 1.82
Platelets: 99 — AB (ref 150–399)
WBC: 3.3

## 2020-09-10 LAB — CBC: RBC: 3.58 — AB (ref 3.87–5.11)

## 2020-09-10 MED ORDER — EPOETIN ALFA-EPBX 40000 UNIT/ML IJ SOLN
40000.0000 [IU] | Freq: Once | INTRAMUSCULAR | Status: AC
  Administered 2020-09-10: 40000 [IU] via SUBCUTANEOUS

## 2020-09-10 MED ORDER — EPOETIN ALFA-EPBX 40000 UNIT/ML IJ SOLN
INTRAMUSCULAR | Status: AC
  Filled 2020-09-10: qty 1

## 2020-09-27 NOTE — Progress Notes (Signed)
Hannah [name]  [address] [place],  [place]  [address] [phone]  Clinic Day:  10/05/2020  Referring physician: Maris Berger, MD  This document serves as a record of services personally performed by Hannah Macarthur Critchley, MD. It was created on their behalf by [facility] E, a trained medical scribe. The creation of this record is based on the scribe's personal observations and the provider's statements to them.  HISTORY OF PRESENT ILLNESS:  The patient is a 70 y.o. female with anemia secondary to chronic renal insufficiency.  She has needed red cell shot therapy over these past few months to get her hemoglobin closer to 10.  She comes in today for routine follow up.  Since her last visit, the patient has been doing fairly well.  From an anemia standpoint, she has had increased fatigue, but denies having any overt forms of blood loss.  PHYSICAL EXAM:  Blood pressure 137/67, pulse 82, temperature 98.1 F (36.7 C), temperature source Oral, resp. rate 20, weight 185 lb (83.9 kg), SpO2 96 %. Wt Readings from Last 3 Encounters:  10/05/20 185 lb (83.9 kg)  09/10/20 178 lb (80.7 kg)  08/08/20 179 lb 4 oz (81.3 kg)   Body mass index is 29.86 kg/m. Performance status (ECOG): 1 - Symptomatic but completely ambulatory Physical Exam Constitutional:      General: She is not in acute distress.    Appearance: Normal appearance. She is normal weight.  HENT:     Head: Normocephalic and atraumatic.  Eyes:     General: No scleral icterus.    Extraocular Movements: Extraocular movements intact.     Conjunctiva/sclera: Conjunctivae normal.     Pupils: Pupils are equal, round, and reactive to light.  Cardiovascular:     Rate and Rhythm: Normal rate and regular rhythm.     Pulses: Normal pulses.     Heart sounds: Normal heart sounds. No murmur heard.   No friction rub. No gallop.  Pulmonary:     Effort: Pulmonary effort is normal. No respiratory distress.      Breath sounds: Normal breath sounds.  Abdominal:     General: Bowel sounds are normal. There is no distension.     Palpations: Abdomen is soft. There is no hepatomegaly, splenomegaly or mass.     Tenderness: There is no abdominal tenderness.  Musculoskeletal:        General: Normal range of motion.     Cervical back: Normal range of motion and neck supple.     Right lower leg: No edema.     Left lower leg: No edema.  Lymphadenopathy:     Cervical: No cervical adenopathy.  Skin:    General: Skin is warm and dry.  Neurological:     General: No focal deficit present.     Mental Status: She is alert and oriented to person, place, and time. Mental status is at baseline.  Psychiatric:        Mood and Affect: Mood normal.        Behavior: Behavior normal.        Thought Content: Thought content normal.        Judgment: Judgment normal.    LABS:    Ref. Range 10/05/2020 00:00  Sodium Latest Ref Range: 137 - 147  140  Potassium Latest Ref Range: 3.4 - 5.3  4.6  Chloride Latest Ref Range: 99 - 108  105  CO2 Latest Ref Range: 13 - 22  25 (A)  Glucose Unknown 132  BUN Latest Ref Range: 4 - 21  75 (A)  Creatinine Latest Ref Range: 0.5 - 1.1  3.8 (A)  Calcium Latest Ref Range: 8.7 - 10.7  9.0  Alkaline Phosphatase Latest Ref Range: 25 - 125  100  Albumin Latest Ref Range: 3.5 - 5.0  3.9  AST Latest Ref Range: 13 - 35  41 (A)  ALT Latest Ref Range: 7 - 35  20  Bilirubin, Total Unknown 0.4  WBC Unknown 3.0  RBC Latest Ref Range: 3.87 - 5.11  3.58 (A)  Hemoglobin Latest Ref Range: 12.0 - 16.0  9.1 (A)  HCT Latest Ref Range: 36 - 46  29 (A)  Platelets Latest Ref Range: 150 - 399  100 (A)  NEUT# Unknown 1.53    Ref. Range 10/05/2020 15:25  Iron Latest Ref Range: 28 - 170 ug/dL 43  UIBC Latest Units: ug/dL 261  TIBC Latest Ref Range: 250 - 450 ug/dL 304  Saturation Ratios Latest Ref Range: 10.4 - 31.8 % 14  Ferritin Latest Ref Range: 11 - 307 ng/mL 281   ASSESSMENT & PLAN:   Assessment/Plan:  A 70 y.o. female with anemia secondary to chronic renal insufficiency.  As her hemoglobin is below 10, I will arrange for her to receive Retacrit 40,000 units next week, as well as monthly if her hemoglobin remains below 10.   I will continue to follow her CBC monthly.   Of note, her white cells and platelets have been low as well.  I will see her back in 3 months for repeat clinical assessment.  If her hemoglobin continues to show no ideal response to her Retacrit shots and her other counts remain low, the patient understands a bone marrow biopsy may need to be considered to rule out an intrinsic marrow disorder.  The patient understands all the plans discussed today and is in agreement with them.     I, Rita Ohara, am acting as scribe for Marice Potter, MD    I have reviewed this report as typed by the medical scribe, and it is complete and accurate.  Hannah Macarthur Critchley, MD

## 2020-10-01 ENCOUNTER — Telehealth: Payer: Self-pay | Admitting: Oncology

## 2020-10-01 NOTE — Telephone Encounter (Signed)
Patient called to verify Sept Appt's

## 2020-10-05 ENCOUNTER — Inpatient Hospital Stay (INDEPENDENT_AMBULATORY_CARE_PROVIDER_SITE_OTHER): Payer: Medicare Other | Admitting: Oncology

## 2020-10-05 ENCOUNTER — Encounter: Payer: Self-pay | Admitting: Oncology

## 2020-10-05 ENCOUNTER — Inpatient Hospital Stay: Payer: Medicare Other | Attending: Oncology

## 2020-10-05 ENCOUNTER — Other Ambulatory Visit: Payer: Self-pay | Admitting: Hematology and Oncology

## 2020-10-05 ENCOUNTER — Other Ambulatory Visit: Payer: Self-pay | Admitting: Oncology

## 2020-10-05 VITALS — BP 137/67 | HR 82 | Temp 98.1°F | Resp 20 | Wt 185.0 lb

## 2020-10-05 DIAGNOSIS — D631 Anemia in chronic kidney disease: Secondary | ICD-10-CM

## 2020-10-05 DIAGNOSIS — N189 Chronic kidney disease, unspecified: Secondary | ICD-10-CM | POA: Diagnosis not present

## 2020-10-05 LAB — BASIC METABOLIC PANEL
BUN: 75 — AB (ref 4–21)
CO2: 25 — AB (ref 13–22)
Chloride: 105 (ref 99–108)
Creatinine: 3.8 — AB (ref 0.5–1.1)
Glucose: 132
Potassium: 4.6 (ref 3.4–5.3)
Sodium: 140 (ref 137–147)

## 2020-10-05 LAB — CBC AND DIFFERENTIAL
HCT: 29 — AB (ref 36–46)
Hemoglobin: 9.1 — AB (ref 12.0–16.0)
Neutrophils Absolute: 1.53
Platelets: 100 — AB (ref 150–399)
WBC: 3

## 2020-10-05 LAB — IRON AND TIBC
Iron: 43 ug/dL (ref 28–170)
Saturation Ratios: 14 % (ref 10.4–31.8)
TIBC: 304 ug/dL (ref 250–450)
UIBC: 261 ug/dL

## 2020-10-05 LAB — HEPATIC FUNCTION PANEL
ALT: 20 (ref 7–35)
AST: 41 — AB (ref 13–35)
Alkaline Phosphatase: 100 (ref 25–125)
Bilirubin, Total: 0.4

## 2020-10-05 LAB — FERRITIN: Ferritin: 281 ng/mL (ref 11–307)

## 2020-10-05 LAB — COMPREHENSIVE METABOLIC PANEL
Albumin: 3.9 (ref 3.5–5.0)
Calcium: 9 (ref 8.7–10.7)

## 2020-10-05 LAB — CBC: RBC: 3.58 — AB (ref 3.87–5.11)

## 2020-10-09 ENCOUNTER — Ambulatory Visit: Payer: Medicare Other

## 2020-10-10 ENCOUNTER — Other Ambulatory Visit: Payer: Self-pay

## 2020-10-10 ENCOUNTER — Inpatient Hospital Stay: Payer: Medicare Other

## 2020-10-10 VITALS — BP 152/67 | HR 74 | Temp 98.0°F | Resp 18 | Ht 66.0 in | Wt 183.2 lb

## 2020-10-10 DIAGNOSIS — D631 Anemia in chronic kidney disease: Secondary | ICD-10-CM

## 2020-10-10 DIAGNOSIS — N189 Chronic kidney disease, unspecified: Secondary | ICD-10-CM | POA: Diagnosis not present

## 2020-10-10 MED ORDER — EPOETIN ALFA-EPBX 40000 UNIT/ML IJ SOLN
40000.0000 [IU] | Freq: Once | INTRAMUSCULAR | Status: AC
  Administered 2020-10-10: 40000 [IU] via SUBCUTANEOUS
  Filled 2020-10-10: qty 1

## 2020-10-10 NOTE — Patient Instructions (Signed)
Epoetin Alfa injection What is this medication? EPOETIN ALFA (e POE e tin AL fa) helps your body make more red blood cells. This medicine is used to treat anemia caused by chronic kidney disease, cancer chemotherapy, or HIV-therapy. It may also be used before surgery if you have anemia. This medicine may be used for other purposes; ask your health care provider or pharmacist if you have questions. COMMON BRAND NAME(S): Epogen, Procrit, Retacrit What should I tell my care team before I take this medication? They need to know if you have any of these conditions: cancer heart disease high blood pressure history of blood clots history of stroke low levels of folate, iron, or vitamin B12 in the blood seizures an unusual or allergic reaction to erythropoietin, albumin, benzyl alcohol, hamster proteins, other medicines, foods, dyes, or preservatives pregnant or trying to get pregnant breast-feeding How should I use this medication? This medicine is for injection into a vein or under the skin. It is usually given by a health care professional in a hospital or clinic setting. If you get this medicine at home, you will be taught how to prepare and give this medicine. Use exactly as directed. Take your medicine at regular intervals. Do not take your medicine more often than directed. It is important that you put your used needles and syringes in a special sharps container. Do not put them in a trash can. If you do not have a sharps container, call your pharmacist or healthcare provider to get one. A special MedGuide will be given to you by the pharmacist with each prescription and refill. Be sure to read this information carefully each time. Talk to your pediatrician regarding the use of this medicine in children. While this drug may be prescribed for selected conditions, precautions do apply. Overdosage: If you think you have taken too much of this medicine contact a poison control center or emergency  room at once. NOTE: This medicine is only for you. Do not share this medicine with others. What if I miss a dose? If you miss a dose, take it as soon as you can. If it is almost time for your next dose, take only that dose. Do not take double or extra doses. What may interact with this medication? Interactions have not been studied. This list may not describe all possible interactions. Give your health care provider a list of all the medicines, herbs, non-prescription drugs, or dietary supplements you use. Also tell them if you smoke, drink alcohol, or use illegal drugs. Some items may interact with your medicine. What should I watch for while using this medication? Your condition will be monitored carefully while you are receiving this medicine. You may need blood work done while you are taking this medicine. This medicine may cause a decrease in vitamin B6. You should make sure that you get enough vitamin B6 while you are taking this medicine. Discuss the foods you eat and the vitamins you take with your health care professional. What side effects may I notice from receiving this medication? Side effects that you should report to your doctor or health care professional as soon as possible: allergic reactions like skin rash, itching or hives, swelling of the face, lips, or tongue seizures signs and symptoms of a blood clot such as breathing problems; changes in vision; chest pain; severe, sudden headache; pain, swelling, warmth in the leg; trouble speaking; sudden numbness or weakness of the face, arm or leg signs and symptoms of a stroke like   changes in vision; confusion; trouble speaking or understanding; severe headaches; sudden numbness or weakness of the face, arm or leg; trouble walking; dizziness; loss of balance or coordination Side effects that usually do not require medical attention (report to your doctor or health care professional if they continue or are  bothersome): chills cough dizziness fever headaches joint pain muscle cramps muscle pain nausea, vomiting pain, redness, or irritation at site where injected This list may not describe all possible side effects. Call your doctor for medical advice about side effects. You may report side effects to FDA at 1-800-FDA-1088. Where should I keep my medication? Keep out of the reach of children. Store in a refrigerator between 2 and 8 degrees C (36 and 46 degrees F). Do not freeze or shake. Throw away any unused portion if using a single-dose vial. Multi-dose vials can be kept in the refrigerator for up to 21 days after the initial dose. Throw away unused medicine. NOTE: This sheet is a summary. It may not cover all possible information. If you have questions about this medicine, talk to your doctor, pharmacist, or health care provider.  2022 Elsevier/Gold Standard (2016-08-29 08:35:19)  

## 2020-10-18 ENCOUNTER — Encounter: Payer: Self-pay | Admitting: Oncology

## 2020-11-07 ENCOUNTER — Encounter: Payer: Self-pay | Admitting: Oncology

## 2020-11-07 NOTE — Addendum Note (Signed)
Addended by: Juanetta Beets on: 11/07/2020 02:40 PM   Modules accepted: Orders

## 2020-11-08 ENCOUNTER — Telehealth: Payer: Self-pay | Admitting: Oncology

## 2020-11-08 NOTE — Telephone Encounter (Signed)
Patient rescheduled 10/12 Labs, Injection to 10/13

## 2020-11-13 ENCOUNTER — Other Ambulatory Visit: Payer: Self-pay | Admitting: Pharmacist

## 2020-11-14 ENCOUNTER — Ambulatory Visit: Payer: Medicare Other

## 2020-11-14 ENCOUNTER — Other Ambulatory Visit: Payer: Medicare Other

## 2020-11-15 ENCOUNTER — Inpatient Hospital Stay: Payer: Medicare Other

## 2020-11-15 ENCOUNTER — Other Ambulatory Visit: Payer: Self-pay

## 2020-11-15 ENCOUNTER — Inpatient Hospital Stay: Payer: Medicare Other | Attending: Oncology

## 2020-11-15 ENCOUNTER — Other Ambulatory Visit: Payer: Self-pay | Admitting: Hematology and Oncology

## 2020-11-15 VITALS — BP 176/76 | HR 72 | Temp 98.1°F | Resp 20 | Ht 66.0 in | Wt 188.2 lb

## 2020-11-15 DIAGNOSIS — N189 Chronic kidney disease, unspecified: Secondary | ICD-10-CM | POA: Insufficient documentation

## 2020-11-15 DIAGNOSIS — D631 Anemia in chronic kidney disease: Secondary | ICD-10-CM

## 2020-11-15 DIAGNOSIS — N185 Chronic kidney disease, stage 5: Secondary | ICD-10-CM

## 2020-11-15 LAB — IRON AND TIBC
Iron: 52 ug/dL (ref 28–170)
Saturation Ratios: 16 % (ref 10.4–31.8)
TIBC: 331 ug/dL (ref 250–450)
UIBC: 279 ug/dL

## 2020-11-15 LAB — HEPATIC FUNCTION PANEL
ALT: 19 (ref 7–35)
AST: 40 — AB (ref 13–35)
Alkaline Phosphatase: 104 (ref 25–125)
Bilirubin, Total: 0.5

## 2020-11-15 LAB — CBC
MCV: 80 — AB (ref 81–99)
RBC: 3.66 — AB (ref 3.87–5.11)

## 2020-11-15 LAB — CBC AND DIFFERENTIAL
HCT: 29 — AB (ref 36–46)
Hemoglobin: 9.2 — AB (ref 12.0–16.0)
Neutrophils Absolute: 1.8
Platelets: 102 — AB (ref 150–399)
WBC: 3.4

## 2020-11-15 LAB — FERRITIN: Ferritin: 328 ng/mL — ABNORMAL HIGH (ref 11–307)

## 2020-11-15 LAB — BASIC METABOLIC PANEL
BUN: 62 — AB (ref 4–21)
CO2: 25 — AB (ref 13–22)
Chloride: 105 (ref 99–108)
Creatinine: 3.8 — AB (ref 0.5–1.1)
Glucose: 118
Potassium: 4.4 (ref 3.4–5.3)
Sodium: 140 (ref 137–147)

## 2020-11-15 LAB — COMPREHENSIVE METABOLIC PANEL
Albumin: 4.2 (ref 3.5–5.0)
Calcium: 9.2 (ref 8.7–10.7)

## 2020-11-15 MED ORDER — EPOETIN ALFA-EPBX 40000 UNIT/ML IJ SOLN
40000.0000 [IU] | Freq: Once | INTRAMUSCULAR | Status: AC
  Administered 2020-11-15: 40000 [IU] via SUBCUTANEOUS
  Filled 2020-11-15: qty 1

## 2020-11-15 NOTE — Patient Instructions (Signed)
Epoetin Alfa injection What is this medication? EPOETIN ALFA (e POE e tin AL fa) helps your body make more red blood cells. This medicine is used to treat anemia caused by chronic kidney disease, cancer chemotherapy, or HIV-therapy. It may also be used before surgery if you have anemia. This medicine may be used for other purposes; ask your health care provider or pharmacist if you have questions. COMMON BRAND NAME(S): Epogen, Procrit, Retacrit What should I tell my care team before I take this medication? They need to know if you have any of these conditions: cancer heart disease high blood pressure history of blood clots history of stroke low levels of folate, iron, or vitamin B12 in the blood seizures an unusual or allergic reaction to erythropoietin, albumin, benzyl alcohol, hamster proteins, other medicines, foods, dyes, or preservatives pregnant or trying to get pregnant breast-feeding How should I use this medication? This medicine is for injection into a vein or under the skin. It is usually given by a health care professional in a hospital or clinic setting. If you get this medicine at home, you will be taught how to prepare and give this medicine. Use exactly as directed. Take your medicine at regular intervals. Do not take your medicine more often than directed. It is important that you put your used needles and syringes in a special sharps container. Do not put them in a trash can. If you do not have a sharps container, call your pharmacist or healthcare provider to get one. A special MedGuide will be given to you by the pharmacist with each prescription and refill. Be sure to read this information carefully each time. Talk to your pediatrician regarding the use of this medicine in children. While this drug may be prescribed for selected conditions, precautions do apply. Overdosage: If you think you have taken too much of this medicine contact a poison control center or emergency  room at once. NOTE: This medicine is only for you. Do not share this medicine with others. What if I miss a dose? If you miss a dose, take it as soon as you can. If it is almost time for your next dose, take only that dose. Do not take double or extra doses. What may interact with this medication? Interactions have not been studied. This list may not describe all possible interactions. Give your health care provider a list of all the medicines, herbs, non-prescription drugs, or dietary supplements you use. Also tell them if you smoke, drink alcohol, or use illegal drugs. Some items may interact with your medicine. What should I watch for while using this medication? Your condition will be monitored carefully while you are receiving this medicine. You may need blood work done while you are taking this medicine. This medicine may cause a decrease in vitamin B6. You should make sure that you get enough vitamin B6 while you are taking this medicine. Discuss the foods you eat and the vitamins you take with your health care professional. What side effects may I notice from receiving this medication? Side effects that you should report to your doctor or health care professional as soon as possible: allergic reactions like skin rash, itching or hives, swelling of the face, lips, or tongue seizures signs and symptoms of a blood clot such as breathing problems; changes in vision; chest pain; severe, sudden headache; pain, swelling, warmth in the leg; trouble speaking; sudden numbness or weakness of the face, arm or leg signs and symptoms of a stroke like   changes in vision; confusion; trouble speaking or understanding; severe headaches; sudden numbness or weakness of the face, arm or leg; trouble walking; dizziness; loss of balance or coordination Side effects that usually do not require medical attention (report to your doctor or health care professional if they continue or are  bothersome): chills cough dizziness fever headaches joint pain muscle cramps muscle pain nausea, vomiting pain, redness, or irritation at site where injected This list may not describe all possible side effects. Call your doctor for medical advice about side effects. You may report side effects to FDA at 1-800-FDA-1088. Where should I keep my medication? Keep out of the reach of children. Store in a refrigerator between 2 and 8 degrees C (36 and 46 degrees F). Do not freeze or shake. Throw away any unused portion if using a single-dose vial. Multi-dose vials can be kept in the refrigerator for up to 21 days after the initial dose. Throw away unused medicine. NOTE: This sheet is a summary. It may not cover all possible information. If you have questions about this medicine, talk to your doctor, pharmacist, or health care provider.  2022 Elsevier/Gold Standard (2016-08-29 08:35:19)  

## 2020-11-15 NOTE — Progress Notes (Signed)
Discharged home, stable. Patient states understanding to follow up with primary care about BP control, Patient states she will also continue to follow up with nephrology as scheduled.

## 2020-12-26 NOTE — Progress Notes (Signed)
Hannah [name]  [address] [place],  [place]  [id number] [id number]  Clinic Day:  01/04/2021  Referring physician: Maris Berger, MD  This document serves as a record of services personally performed by Miana Politte Macarthur Critchley, MD. It was created on their behalf by [facility], a trained medical scribe. The creation of this record is based on the scribe's personal observations and the provider's statements to them.  HISTORY OF PRESENT ILLNESS:  The patient is a 70 y.o. female with anemia secondary to chronic renal insufficiency.  She has needed red cell shot therapy over these past few months to get her hemoglobin closer to 10.  She comes in today for routine follow up.  Since her last visit, the patient has been doing fairly well.  From an anemia standpoint, she has had increased fatigue, but denies having any overt forms of blood loss.    PHYSICAL EXAM:  Blood pressure (!) 158/72, pulse 71, temperature 97.9 F (36.6 C), resp. rate 14, height 5\' 6"  (1.676 m), weight 174 lb 3.2 oz (79 kg), SpO2 91 %. Wt Readings from Last 3 Encounters:  01/04/21 174 lb 3.2 oz (79 kg)  11/15/20 188 lb 3.2 oz (85.4 kg)  10/10/20 183 lb 4 oz (83.1 kg)   Body mass index is 28.12 kg/m. Performance status (ECOG): 1 - Symptomatic but completely ambulatory Physical Exam Constitutional:      General: She is not in acute distress.    Appearance: Normal appearance. She is normal weight.  HENT:     Head: Normocephalic and atraumatic.  Eyes:     General: No scleral icterus.    Extraocular Movements: Extraocular movements intact.     Conjunctiva/sclera: Conjunctivae normal.     Pupils: Pupils are equal, round, and reactive to light.  Cardiovascular:     Rate and Rhythm: Normal rate and regular rhythm.     Pulses: Normal pulses.     Heart sounds: Normal heart sounds. No murmur heard.   No friction rub. No gallop.  Pulmonary:     Effort: Pulmonary effort is normal. No  respiratory distress.     Breath sounds: Normal breath sounds.  Abdominal:     General: Bowel sounds are normal. There is no distension.     Palpations: Abdomen is soft. There is no hepatomegaly, splenomegaly or mass.     Tenderness: There is no abdominal tenderness.  Musculoskeletal:        General: Normal range of motion.     Cervical back: Normal range of motion and neck supple.     Right lower leg: No edema.     Left lower leg: No edema.  Lymphadenopathy:     Cervical: No cervical adenopathy.  Skin:    General: Skin is warm and dry.  Neurological:     General: No focal deficit present.     Mental Status: She is alert and oriented to person, place, and time. Mental status is at baseline.  Psychiatric:        Mood and Affect: Mood normal.        Behavior: Behavior normal.        Thought Content: Thought content normal.        Judgment: Judgment normal.    LABS:     Latest Reference Range & Units 11/15/20 13:24 01/04/21 09:59  Iron 28 - 170 ug/dL 52 38  UIBC ug/dL 279 308  TIBC 250 - 450 ug/dL 331 346  Saturation Ratios  10.4 - 31.8 % 16 11  Ferritin 11 - 307 ng/mL 328 (H) 188  (H): Data is abnormally high  ASSESSMENT & PLAN:  Assessment/Plan:  A 70 y.o. female with anemia secondary to chronic renal insufficiency.  When evaluating her labs, her MCV and iron parameters have gradually fallen over time.  As there appears to be a component of iron deficiency present, I will arrange for her to receive IV iron over these next few weeks.   Of note, her white cells and platelets remain low as well.  This raises the suspicion for there potentially being an underlying bone marrow problem.  When evaluating her kidney parameters, she understands she in on the precipice of having end-stage renal disease.  It will be imperative to keep her blood pressure and diabetes under the best possible control to preserve what little renal function she has left.  Otherwise, I will see her back in 3 months  for repeat clinical assessment. The patient understands all the plans discussed today and is in agreement with them.     I, Rita Ohara, am acting as scribe for Marice Potter, MD    I have reviewed this report as typed by the medical scribe, and it is complete and accurate.  Quantavis Obryant Macarthur Critchley, MD

## 2021-01-04 ENCOUNTER — Inpatient Hospital Stay: Payer: Medicare Other | Attending: Oncology | Admitting: Oncology

## 2021-01-04 ENCOUNTER — Other Ambulatory Visit: Payer: Self-pay | Admitting: Hematology and Oncology

## 2021-01-04 ENCOUNTER — Inpatient Hospital Stay: Payer: Medicare Other

## 2021-01-04 ENCOUNTER — Other Ambulatory Visit: Payer: Self-pay | Admitting: Oncology

## 2021-01-04 VITALS — BP 158/72 | HR 71 | Temp 97.9°F | Resp 14 | Ht 66.0 in | Wt 174.2 lb

## 2021-01-04 DIAGNOSIS — D631 Anemia in chronic kidney disease: Secondary | ICD-10-CM | POA: Insufficient documentation

## 2021-01-04 DIAGNOSIS — N189 Chronic kidney disease, unspecified: Secondary | ICD-10-CM | POA: Diagnosis not present

## 2021-01-04 DIAGNOSIS — D508 Other iron deficiency anemias: Secondary | ICD-10-CM

## 2021-01-04 DIAGNOSIS — N185 Chronic kidney disease, stage 5: Secondary | ICD-10-CM | POA: Diagnosis not present

## 2021-01-04 LAB — HEPATIC FUNCTION PANEL
ALT: 30 (ref 7–35)
AST: 46 — AB (ref 13–35)
Alkaline Phosphatase: 104 (ref 25–125)
Bilirubin, Total: 0.6

## 2021-01-04 LAB — FERRITIN: Ferritin: 188 ng/mL (ref 11–307)

## 2021-01-04 LAB — CBC AND DIFFERENTIAL
HCT: 27 — AB (ref 36–46)
Hemoglobin: 8.7 — AB (ref 12.0–16.0)
Neutrophils Absolute: 1.9
Platelets: 90 — AB (ref 150–399)
WBC: 3.4

## 2021-01-04 LAB — CBC: RBC: 3.4 — AB (ref 3.87–5.11)

## 2021-01-04 LAB — BASIC METABOLIC PANEL
BUN: 81 — AB (ref 4–21)
CO2: 29 — AB (ref 13–22)
Chloride: 105 (ref 99–108)
Creatinine: 4.6 — AB (ref 0.5–1.1)
Glucose: 75
Potassium: 5.2 (ref 3.4–5.3)
Sodium: 143 (ref 137–147)

## 2021-01-04 LAB — COMPREHENSIVE METABOLIC PANEL
Albumin: 4.2 (ref 3.5–5.0)
Calcium: 8.7 (ref 8.7–10.7)

## 2021-01-04 LAB — IRON AND TIBC
Iron: 38 ug/dL (ref 28–170)
Saturation Ratios: 11 % (ref 10.4–31.8)
TIBC: 346 ug/dL (ref 250–450)
UIBC: 308 ug/dL

## 2021-01-07 ENCOUNTER — Encounter: Payer: Self-pay | Admitting: Oncology

## 2021-01-07 DIAGNOSIS — D509 Iron deficiency anemia, unspecified: Secondary | ICD-10-CM | POA: Insufficient documentation

## 2021-01-08 ENCOUNTER — Telehealth: Payer: Self-pay | Admitting: Oncology

## 2021-01-08 NOTE — Telephone Encounter (Signed)
Patient called back to verify 12/12, 12/19, March 2023 Appt's

## 2021-01-09 ENCOUNTER — Encounter: Payer: Self-pay | Admitting: Oncology

## 2021-01-09 NOTE — Addendum Note (Signed)
Addended by: Juanetta Beets on: 01/09/2021 05:15 PM   Modules accepted: Orders

## 2021-01-11 MED FILL — Ferumoxytol Inj 510 MG/17ML (30 MG/ML) (Elemental Fe): INTRAVENOUS | Qty: 17 | Status: AC

## 2021-01-14 ENCOUNTER — Inpatient Hospital Stay: Payer: Medicare Other

## 2021-01-14 ENCOUNTER — Ambulatory Visit: Payer: Medicare Other

## 2021-01-14 ENCOUNTER — Other Ambulatory Visit: Payer: Self-pay

## 2021-01-14 VITALS — BP 161/78 | HR 77 | Temp 98.2°F | Resp 18 | Ht 66.0 in | Wt 174.0 lb

## 2021-01-14 DIAGNOSIS — D631 Anemia in chronic kidney disease: Secondary | ICD-10-CM

## 2021-01-14 DIAGNOSIS — N185 Chronic kidney disease, stage 5: Secondary | ICD-10-CM

## 2021-01-14 MED ORDER — SODIUM CHLORIDE 0.9 % IV SOLN
510.0000 mg | Freq: Once | INTRAVENOUS | Status: AC
  Administered 2021-01-14: 510 mg via INTRAVENOUS
  Filled 2021-01-14: qty 17

## 2021-01-14 MED ORDER — SODIUM CHLORIDE 0.9 % IV SOLN: Freq: Once | INTRAVENOUS | Status: AC

## 2021-01-14 NOTE — Patient Instructions (Signed)

## 2021-01-18 MED FILL — Ferumoxytol Inj 510 MG/17ML (30 MG/ML) (Elemental Fe): INTRAVENOUS | Qty: 17 | Status: AC

## 2021-01-21 ENCOUNTER — Ambulatory Visit: Payer: Medicare Other

## 2021-01-21 ENCOUNTER — Inpatient Hospital Stay: Payer: Medicare Other

## 2021-01-21 ENCOUNTER — Other Ambulatory Visit: Payer: Self-pay

## 2021-01-21 VITALS — BP 141/71 | HR 66 | Temp 98.8°F | Resp 18 | Ht 66.0 in | Wt 172.8 lb

## 2021-01-21 DIAGNOSIS — N185 Chronic kidney disease, stage 5: Secondary | ICD-10-CM | POA: Diagnosis not present

## 2021-01-21 MED ORDER — SODIUM CHLORIDE 0.9 % IV SOLN
510.0000 mg | Freq: Once | INTRAVENOUS | Status: AC
  Administered 2021-01-21: 510 mg via INTRAVENOUS
  Filled 2021-01-21: qty 17

## 2021-01-21 MED ORDER — SODIUM CHLORIDE 0.9 % IV SOLN: Freq: Once | INTRAVENOUS | Status: AC

## 2021-01-21 NOTE — Progress Notes (Signed)
1258:PT STABLE AT TIME OF DISCHARGE

## 2021-01-21 NOTE — Patient Instructions (Signed)

## 2021-02-05 ENCOUNTER — Other Ambulatory Visit: Payer: Medicare Other

## 2021-02-05 ENCOUNTER — Ambulatory Visit: Payer: Medicare Other | Admitting: Oncology

## 2021-02-06 ENCOUNTER — Other Ambulatory Visit: Payer: Medicare Other

## 2021-02-06 ENCOUNTER — Ambulatory Visit: Payer: Medicare Other | Admitting: Oncology

## 2021-04-21 NOTE — Progress Notes (Signed)
?[facility]  ?[address] ?[place]  [id number] ?[phone] ? ?Clinic Day:  3/20 oh/2022 ? ?Referring physician: Maris Berger, MD ? ?HISTORY OF PRESENT ILLNESS:  ?The patient is a 71 y.o. female with anemia secondary to chronic renal insufficiency.  Labs recently also show that she was iron deficient to where she was recently given IV iron to bolster her iron stores with the hopes that it would also improve her hemoglobin.  She comes in today to reassess her peripheral counts.  Since her last visit, the patient has been doing fairly well.  She denies having increased fatigue, overt forms of blood loss, or other symptoms which concern her for progressive anemia.    ? ?PHYSICAL EXAM:  ?Blood pressure (!) 149/65, pulse 65, temperature (!) 97.5 ?F (36.4 ?C), resp. rate 16, height 5' 6"  (1.676 m), weight 170 lb 14.4 oz (77.5 kg), SpO2 90 %. ?Wt Readings from Last 3 Encounters:  ?04/22/21 170 lb 14.4 oz (77.5 kg)  ?01/21/21 172 lb 12 oz (78.4 kg)  ?01/14/21 174 lb (78.9 kg)  ? ?Body mass index is 27.58 kg/m?Marland Kitchen ?Performance status (ECOG): 1 - Symptomatic but completely ambulatory ?Physical Exam ?Constitutional:   ?   General: She is not in acute distress. ?   Appearance: Normal appearance. She is normal weight.  ?HENT:  ?   Head: Normocephalic and atraumatic.  ?Eyes:  ?   General: No scleral icterus. ?   Extraocular Movements: Extraocular movements intact.  ?   Conjunctiva/sclera: Conjunctivae normal.  ?   Pupils: Pupils are equal, round, and reactive to light.  ?Cardiovascular:  ?   Rate and Rhythm: Normal rate and regular rhythm.  ?   Pulses: Normal pulses.  ?   Heart sounds: Normal heart sounds. No murmur heard. ?  No friction rub. No gallop.  ?Pulmonary:  ?   Effort: Pulmonary effort is normal. No respiratory distress.  ?   Breath sounds: Normal breath sounds.  ?Abdominal:  ?   General: Bowel sounds are normal. There is no distension.  ?   Palpations: Abdomen is soft.  There is no hepatomegaly, splenomegaly or mass.  ?   Tenderness: There is no abdominal tenderness.  ?Musculoskeletal:     ?   General: Normal range of motion.  ?   Cervical back: Normal range of motion and neck supple.  ?   Right lower leg: No edema.  ?   Left lower leg: No edema.  ?Lymphadenopathy:  ?   Cervical: No cervical adenopathy.  ?Skin: ?   General: Skin is warm and dry.  ?Neurological:  ?   General: No focal deficit present.  ?   Mental Status: She is alert and oriented to person, place, and time. Mental status is at baseline.  ?Psychiatric:     ?   Mood and Affect: Mood normal.     ?   Behavior: Behavior normal.     ?   Thought Content: Thought content normal.     ?   Judgment: Judgment normal.  ? ? ?LABS:  ? ? Latest Reference Range & Units 04/22/21 00:00  ?WBC  3.4 (E)  ?RBC 3.87 - 5.11  3.43 ! (E)  ?Hemoglobin 12.0 - 16.0  8.8 ! (E)  ?HCT 36 - 46  28 ! (E)  ?Platelets 150 - 400 K/uL 94 ! (E)  ?!: Data is abnormal ?(E): External lab result ? Latest Reference Range & Units 04/22/21 10:39  ?  Iron 28 - 170 ug/dL 57  ?UIBC ug/dL 261  ?TIBC 250 - 450 ug/dL 318  ?Saturation Ratios 10.4 - 31.8 % 18  ?Ferritin 11 - 307 ng/mL 665 (H)  ?(H): Data is abnormally high ? Latest Reference Range & Units 04/22/21 00:00  ?Sodium 137 - 147  142 (E)  ?Potassium 3.5 - 5.1 mEq/L 4.3 (E)  ?Chloride 99 - 108  103 (E)  ?CO2 13 - 22  28 ! (E)  ?Glucose  122 (E)  ?BUN 4 - 21  88 ! (E)  ?Creatinine 0.5 - 1.1  4.5 ! (E)  ?Calcium 8.7 - 10.7  8.5 ! (E)  ?Alkaline Phosphatase 25 - 125  100 (E)  ?Albumin 3.5 - 5.0  4.3 (E)  ?AST 13 - 35  61 ! (E)  ?ALT 7 - 35 U/L 41 ! (E)  ?Bilirubin, Total  0.6 (E)  ?!: Data is abnormal ?(E): External lab result ? ?ASSESSMENT & PLAN:  ?Assessment/Plan:  A 71 y.o. female with anemia secondary to chronic renal insufficiency, as well as a component of iron deficiency.  Unfortunately, despite having received IV iron recently, this patient's hemoglobin remains very suboptimal.  On top of that, the patient  continues to have a low white cell and platelet count.  Based upon these findings, my concern is that there may be a bone marrow issue that is causing all of her 3 blood cell lines to be impacted.  I made sure the patient understood neither iron deficiency nor kidney disease factors into one's white cells or platelets being low.  Based upon this, I will have this patient undergo a bone marrow biopsy later this week to rule out any intrinsic marrow disorders behind her low blood counts.  I will see her back 2 weeks later to go over her bone marrow biopsy results and their implications.  The patient understands all the plans discussed today and is in agreement with them.   ? ?Briele Lagasse Macarthur Critchley, MD ? ? ? ?  ?

## 2021-04-22 ENCOUNTER — Other Ambulatory Visit: Payer: Self-pay | Admitting: Oncology

## 2021-04-22 ENCOUNTER — Inpatient Hospital Stay: Payer: Medicare Other | Admitting: Oncology

## 2021-04-22 ENCOUNTER — Inpatient Hospital Stay: Payer: Medicare Other | Attending: Oncology

## 2021-04-22 ENCOUNTER — Other Ambulatory Visit: Payer: Self-pay

## 2021-04-22 VITALS — BP 149/65 | HR 65 | Temp 97.5°F | Resp 16 | Ht 66.0 in | Wt 170.9 lb

## 2021-04-22 DIAGNOSIS — D72819 Decreased white blood cell count, unspecified: Secondary | ICD-10-CM | POA: Insufficient documentation

## 2021-04-22 DIAGNOSIS — N189 Chronic kidney disease, unspecified: Secondary | ICD-10-CM

## 2021-04-22 DIAGNOSIS — D631 Anemia in chronic kidney disease: Secondary | ICD-10-CM | POA: Diagnosis not present

## 2021-04-22 DIAGNOSIS — D509 Iron deficiency anemia, unspecified: Secondary | ICD-10-CM | POA: Insufficient documentation

## 2021-04-22 DIAGNOSIS — D61818 Other pancytopenia: Secondary | ICD-10-CM | POA: Insufficient documentation

## 2021-04-22 LAB — BASIC METABOLIC PANEL
BUN: 88 — AB (ref 4–21)
CO2: 28 — AB (ref 13–22)
Chloride: 103 (ref 99–108)
Creatinine: 4.5 — AB (ref 0.5–1.1)
Glucose: 122
Potassium: 4.3 mEq/L (ref 3.5–5.1)
Sodium: 142 (ref 137–147)

## 2021-04-22 LAB — CBC: RBC: 3.43 — AB (ref 3.87–5.11)

## 2021-04-22 LAB — CBC AND DIFFERENTIAL
HCT: 28 — AB (ref 36–46)
Hemoglobin: 8.8 — AB (ref 12.0–16.0)
Neutrophils Absolute: 1.84
Platelets: 94 10*3/uL — AB (ref 150–400)
WBC: 3.4

## 2021-04-22 LAB — HEPATIC FUNCTION PANEL
ALT: 41 U/L — AB (ref 7–35)
AST: 61 — AB (ref 13–35)
Alkaline Phosphatase: 100 (ref 25–125)
Bilirubin, Total: 0.6

## 2021-04-22 LAB — COMPREHENSIVE METABOLIC PANEL
Albumin: 4.3 (ref 3.5–5.0)
Calcium: 8.5 — AB (ref 8.7–10.7)

## 2021-04-22 LAB — IRON AND TIBC
Iron: 57 ug/dL (ref 28–170)
Saturation Ratios: 18 % (ref 10.4–31.8)
TIBC: 318 ug/dL (ref 250–450)
UIBC: 261 ug/dL

## 2021-04-22 LAB — FERRITIN: Ferritin: 665 ng/mL — ABNORMAL HIGH (ref 11–307)

## 2021-04-24 ENCOUNTER — Telehealth: Payer: Self-pay | Admitting: Oncology

## 2021-04-24 ENCOUNTER — Inpatient Hospital Stay: Payer: Medicare Other

## 2021-04-24 ENCOUNTER — Other Ambulatory Visit: Payer: Self-pay

## 2021-04-24 ENCOUNTER — Inpatient Hospital Stay (INDEPENDENT_AMBULATORY_CARE_PROVIDER_SITE_OTHER): Payer: Medicare Other | Admitting: Oncology

## 2021-04-24 ENCOUNTER — Other Ambulatory Visit: Payer: Self-pay | Admitting: Oncology

## 2021-04-24 DIAGNOSIS — D61818 Other pancytopenia: Secondary | ICD-10-CM | POA: Insufficient documentation

## 2021-04-24 DIAGNOSIS — D631 Anemia in chronic kidney disease: Secondary | ICD-10-CM

## 2021-04-24 DIAGNOSIS — D649 Anemia, unspecified: Secondary | ICD-10-CM

## 2021-04-24 DIAGNOSIS — D509 Iron deficiency anemia, unspecified: Secondary | ICD-10-CM | POA: Diagnosis not present

## 2021-04-24 LAB — CBC AND DIFFERENTIAL
HCT: 28 — AB (ref 36–46)
Hemoglobin: 8.7 — AB (ref 12.0–16.0)
Neutrophils Absolute: 1.74
Platelets: 101 10*3/uL — AB (ref 150–400)
WBC: 3

## 2021-04-24 LAB — CBC: RBC: 3.41 — AB (ref 3.87–5.11)

## 2021-04-24 NOTE — Progress Notes (Signed)
BONE MARROW PROCEDURE NOTE ?Due to the patient's pancytopenia of undisclosed etiology, a bone marrow biopsy was done today for further evaluation.  After consenting for the procedure, the patient's left posterior superior iliac crest was topically sterilized.  Afterwards, a bone marrow core and aspirate were collected, which will be sent for flow cytometry, cytogenetics, and a myeloma FISH panel.  There were no complications experienced with this procedure ?

## 2021-04-24 NOTE — Telephone Encounter (Signed)
Per 04/24/21 los next appt scheduled and confirmed with patient ?

## 2021-04-25 LAB — SURGICAL PATHOLOGY

## 2021-05-01 ENCOUNTER — Encounter (HOSPITAL_COMMUNITY): Payer: Self-pay | Admitting: Oncology

## 2021-05-02 NOTE — Progress Notes (Signed)
?[facility]  ?[address] ?[place],  [place]  [address] ?[phone] ? ?Clinic Day:  05/03/2021 ? ?Referring physician: Maris Berger, MD ? ?HISTORY OF PRESENT ILLNESS:  ?The patient is a 71 y.o. female with anemia secondary to chronic renal insufficiency and iron deficiency.  Despite receiving IV iron recently, her hemoglobin remains very sluggish.  This led to her undergoing a bone marrow biopsy recently.  He comes in today to go over her bone marrow biopsy results with her implications.  Since her last visit, the patient has been feeling well.  She denies having increased fatigue or any overt forms of blood loss which concern her for progressive anemia.    ? ?PHYSICAL EXAM:  ?Blood pressure (!) 190/88, pulse 72, temperature 97.8 ?F (36.6 ?C), resp. rate 14, height 5' 6"  (1.676 m), weight 172 lb 1.6 oz (78.1 kg), SpO2 97 %. ?Wt Readings from Last 3 Encounters:  ?05/06/21 171 lb 8 oz (77.8 kg)  ?05/03/21 172 lb 1.6 oz (78.1 kg)  ?04/24/21 169 lb (76.7 kg)  ? ?Body mass index is 27.78 kg/m?Marland Kitchen ?Performance status (ECOG): 1 - Symptomatic but completely ambulatory ?Physical Exam ?Constitutional:   ?   General: She is not in acute distress. ?   Appearance: Normal appearance. She is normal weight.  ?HENT:  ?   Head: Normocephalic and atraumatic.  ?Eyes:  ?   General: No scleral icterus. ?   Extraocular Movements: Extraocular movements intact.  ?   Conjunctiva/sclera: Conjunctivae normal.  ?   Pupils: Pupils are equal, round, and reactive to light.  ?Cardiovascular:  ?   Rate and Rhythm: Normal rate and regular rhythm.  ?   Pulses: Normal pulses.  ?   Heart sounds: Normal heart sounds. No murmur heard. ?  No friction rub. No gallop.  ?Pulmonary:  ?   Effort: Pulmonary effort is normal. No respiratory distress.  ?   Breath sounds: Normal breath sounds.  ?Abdominal:  ?   General: Bowel sounds are normal. There is no distension.  ?   Palpations: Abdomen is soft. There is no hepatomegaly,  splenomegaly or mass.  ?   Tenderness: There is no abdominal tenderness.  ?Musculoskeletal:     ?   General: Normal range of motion.  ?   Cervical back: Normal range of motion and neck supple.  ?   Right lower leg: No edema.  ?   Left lower leg: No edema.  ?Lymphadenopathy:  ?   Cervical: No cervical adenopathy.  ?Skin: ?   General: Skin is warm and dry.  ?Neurological:  ?   General: No focal deficit present.  ?   Mental Status: She is alert and oriented to person, place, and time. Mental status is at baseline.  ?Psychiatric:     ?   Mood and Affect: Mood normal.     ?   Behavior: Behavior normal.     ?   Thought Content: Thought content normal.     ?   Judgment: Judgment normal.  ? ?PATHOLOGY: ?DIAGNOSIS:  ? ?BONE MARROW, ASPIRATE, CLOT, CORE:  ?-Hypercellular bone marrow with trilineage hematopoiesis  ?-See comment  ? ?PERIPHERAL BLOOD:  ?-Pancytopenia  ? ?COMMENT:  ? ?The overall findings are not considered specific or diagnostic of a  ?myeloid neoplasm.  Correlation with cytogenetic and FISH studies is  ?recommended  ? ?MICROSCOPIC DESCRIPTION:  ? ?PERIPHERAL BLOOD SMEAR: The red blood cells display mild  ?anisopoikilocytosis with minimal polychromasia.  The  white blood cells  ?are decreased in number but with no significant morphologic  ?abnormalities.  The platelets are decreased in number.  ? ?BONE MARROW ASPIRATE: Bone marrow particles present  ?Erythroid precursors: Progressive maturation with only occasional late  ?precursors displaying nuclear cytoplasmic dyssynchrony or slightly  ?irregular nuclear  ?Granulocytic precursors: Orderly and progressive maturation for the most  ?part  ?Megakaryocytes: Abundant with occasional large hyperchromatic forms or  ?small hypolobated cells  ?Lymphocytes/plasma cells: Large aggregates not present  ? ?TOUCH PREPARATIONS: Mixture of cell types present in a background of  ?blood  ? ?CLOT AND BIOPSY: The core biopsy is suboptimal.  The clot sections  ?display 40 to 60%  cellularity with a mixture of myeloid cell types.  ?Significant lymphoid aggregates or granulomas are not seen.  ? ?IRON STAIN: Iron stains are performed on a bone marrow aspirate or touch  ?imprint smear and section of clot. The controls stained appropriately.  ? ?     Storage Iron: Increased  ?     Ring Sideroblasts: Absent  ? ?ADDITIONAL DATA/TESTING: The specimen was sent for cytogenetic analysis  ?and FISH for MDS and a separate report will follow.  Flow cytometric  ?analysis ([facility] [phone]) failed to show any significant CD34 positive  ?blastic population, monoclonal B-cell population or abnormal T-cell  ?phenotype.  ? ?CELL COUNT DATA:  ? ?Bone Marrow count performed on 500 cells shows:  ?Blasts:   1%   Myeloid:  49%  ?Promyelocytes: 0%   Erythroid:     27%  ?Myelocytes:    4%   Lymphocytes:   18%  ?Metamyelocytes:     5%   Plasma cells:  2%  ?Bands:    16%  ?Neutrophils:   19%  M:E ratio:     1.81  ?Eosinophils:   4%  ?Basophils:     0%  ?Monocytes:     4%  ? ?Lab Data: CBC performed on 04/24/21 shows:  ?WBC: 3.0 k/uL  Neutrophils:   58%  ?Hgb: 8.7 g/dL  Lymphocytes:   23%  ?HCT: 28.2 %    Monocytes:     11%  ?MCV: 83 fL     Eosinophils:   8%  ?RDW: 16.2 %    Basophils:     0%  ?PLT: 101 k/uL  ? ?MYELODYSPLASIA FISH PANEL: ? ? ?Cytogenetics: ? ? ?LABS:  ? ? Latest Reference Range & Units 05/03/21 00:00  ?WBC  3.1 (E)  ?RBC 3.87 - 5.11  3.65 ! (E)  ?Hemoglobin 12.0 - 16.0  9.3 ! (E)  ?HCT 36 - 46  30 ! (E)  ?Platelets 150 - 400 K/uL 107 ! (E)  ?!: Data is abnormal ?(E): External lab result ? Latest Reference Range & Units 05/03/21 00:00  ?Sodium 137 - 147  143 (E)  ?Potassium 3.5 - 5.1 mEq/L 4.3 (E)  ?Chloride 99 - 108  106 (E)  ?CO2 13 - 22  28 ! (E)  ?Glucose  147 (E)  ?BUN 4 - 21  79 ! (E)  ?Creatinine 0.5 - 1.1  3.5 ! (E)  ?Calcium 8.7 - 10.7  9.1 (E)  ?Alkaline Phosphatase 25 - 125  102 (E)  ?Albumin 3.5 - 5.0  4.1 (E)  ?AST 13 - 35  44 ! (E)  ?ALT 7 - 35 U/L 33 (E)  ?Bilirubin, Total  0.5 (E)  ?!: Data is  abnormal ?(E): External lab result ? Latest Reference Range & Units 05/03/21 13:03  ?Iron 28 -  170 ug/dL 58  ?UIBC ug/dL 256  ?TIBC 250 - 450 ug/dL 314  ?Saturation Ratios 10.4 - 31.8 % 18  ?Ferritin 11 - 307 ng/mL 526 (H)  ?(H): Data is abnormally high ? ?ASSESSMENT & PLAN:  ?Assessment/Plan:  A 71 y.o. female with anemia secondary to chronic renal insufficiency, as well as a component of iron deficiency.  In clinic today, I went over all of her bone marrow biopsy results with her for which she was understandably pleased to find out that she has no intrinsic marrow disorders.  In totality, it appears her anemia is primarily due to her worsening renal function.  Her bone marrow and iron studies today clearly show that she has enough iron within her body.  Based upon this, I will increase the frequency of her red cell shots to where she will receive Retacrit 40,000 units every 3 weeks with the hope they will stimulate her bone marrow to make enough red cells to get her hemoglobin above 10.  Despite her anemia, the patient clinically appears to be doing well.  I will see her back in approximately 2 months to reassess her anemia after receiving multiple doses of Retacrit injections.  The patient understands all the plans discussed today and is in agreement with them. ? ?Jermon Chalfant Macarthur Critchley, MD ? ? ? ?  ?

## 2021-05-03 ENCOUNTER — Inpatient Hospital Stay: Payer: Medicare Other | Admitting: Oncology

## 2021-05-03 ENCOUNTER — Inpatient Hospital Stay: Payer: Medicare Other

## 2021-05-03 VITALS — BP 190/88 | HR 72 | Temp 97.8°F | Resp 14 | Ht 66.0 in | Wt 172.1 lb

## 2021-05-03 DIAGNOSIS — D631 Anemia in chronic kidney disease: Secondary | ICD-10-CM

## 2021-05-03 DIAGNOSIS — N189 Chronic kidney disease, unspecified: Secondary | ICD-10-CM

## 2021-05-03 DIAGNOSIS — D509 Iron deficiency anemia, unspecified: Secondary | ICD-10-CM | POA: Diagnosis not present

## 2021-05-03 DIAGNOSIS — D649 Anemia, unspecified: Secondary | ICD-10-CM

## 2021-05-03 LAB — CBC AND DIFFERENTIAL
HCT: 30 — AB (ref 36–46)
Hemoglobin: 9.3 — AB (ref 12.0–16.0)
Neutrophils Absolute: 1.83
Platelets: 107 10*3/uL — AB (ref 150–400)
WBC: 3.1

## 2021-05-03 LAB — HEPATIC FUNCTION PANEL
ALT: 33 U/L (ref 7–35)
AST: 44 — AB (ref 13–35)
Alkaline Phosphatase: 102 (ref 25–125)
Bilirubin, Total: 0.5

## 2021-05-03 LAB — BASIC METABOLIC PANEL
BUN: 79 — AB (ref 4–21)
CO2: 28 — AB (ref 13–22)
Chloride: 106 (ref 99–108)
Creatinine: 3.5 — AB (ref 0.5–1.1)
Glucose: 147
Potassium: 4.3 mEq/L (ref 3.5–5.1)
Sodium: 143 (ref 137–147)

## 2021-05-03 LAB — IRON AND TIBC
Iron: 58 ug/dL (ref 28–170)
Saturation Ratios: 18 % (ref 10.4–31.8)
TIBC: 314 ug/dL (ref 250–450)
UIBC: 256 ug/dL

## 2021-05-03 LAB — FERRITIN: Ferritin: 526 ng/mL — ABNORMAL HIGH (ref 11–307)

## 2021-05-03 LAB — CBC: RBC: 3.65 — AB (ref 3.87–5.11)

## 2021-05-03 LAB — COMPREHENSIVE METABOLIC PANEL
Albumin: 4.1 (ref 3.5–5.0)
Calcium: 9.1 (ref 8.7–10.7)

## 2021-05-06 ENCOUNTER — Encounter (HOSPITAL_COMMUNITY): Payer: Self-pay | Admitting: Oncology

## 2021-05-06 ENCOUNTER — Inpatient Hospital Stay: Payer: Medicare Other | Attending: Oncology

## 2021-05-06 VITALS — BP 156/77 | HR 70 | Temp 97.9°F | Resp 18 | Ht 66.0 in | Wt 171.5 lb

## 2021-05-06 DIAGNOSIS — N185 Chronic kidney disease, stage 5: Secondary | ICD-10-CM | POA: Insufficient documentation

## 2021-05-06 DIAGNOSIS — D631 Anemia in chronic kidney disease: Secondary | ICD-10-CM | POA: Insufficient documentation

## 2021-05-06 MED ORDER — EPOETIN ALFA-EPBX 40000 UNIT/ML IJ SOLN
40000.0000 [IU] | Freq: Once | INTRAMUSCULAR | Status: AC
  Administered 2021-05-06: 40000 [IU] via SUBCUTANEOUS
  Filled 2021-05-06: qty 1

## 2021-05-06 NOTE — Patient Instructions (Signed)
Vitamin B12 Injection ?What is this medication? ?Vitamin B12 (VAHY tuh min B12) prevents and treats low vitamin B12 levels in your body. It is used in people who do not get enough vitamin B12 from their diet or when their digestive tract does not absorb enough. Vitamin B12 plays an important role in maintaining the health of your nervous system and red blood cells. ?This medicine may be used for other purposes; ask your health care provider or pharmacist if you have questions. ?COMMON BRAND NAME(S): B-12 Compliance Kit, B-12 Injection Kit, Cyomin, Dodex, LA-12, Nutri-Twelve, Physicians EZ Use B-12, Primabalt ?What should I tell my care team before I take this medication? ?They need to know if you have any of these conditions: ?Kidney disease ?Leber's disease ?Megaloblastic anemia ?An unusual or allergic reaction to cyanocobalamin, cobalt, other medications, foods, dyes, or preservatives ?Pregnant or trying to get pregnant ?Breast-feeding ?How should I use this medication? ?This medication is injected into a muscle or deeply under the skin. It is usually given in a clinic or care team's office. However, your care team may teach you how to inject yourself. Follow all instructions. ?Talk to your care team about the use of this medication in children. Special care may be needed. ?Overdosage: If you think you have taken too much of this medicine contact a poison control center or emergency room at once. ?NOTE: This medicine is only for you. Do not share this medicine with others. ?What if I miss a dose? ?If you are given your dose at a clinic or care team's office, call to reschedule your appointment. If you give your own injections, and you miss a dose, take it as soon as you can. If it is almost time for your next dose, take only that dose. Do not take double or extra doses. ?What may interact with this medication? ?Colchicine ?Heavy alcohol intake ?This list may not describe all possible interactions. Give your health  care provider a list of all the medicines, herbs, non-prescription drugs, or dietary supplements you use. Also tell them if you smoke, drink alcohol, or use illegal drugs. Some items may interact with your medicine. ?What should I watch for while using this medication? ?Visit your care team regularly. You may need blood work done while you are taking this medication. ?You may need to follow a special diet. Talk to your care team. Limit your alcohol intake and avoid smoking to get the best benefit. ?What side effects may I notice from receiving this medication? ?Side effects that you should report to your care team as soon as possible: ?Allergic reactions--skin rash, itching, hives, swelling of the face, lips, tongue, or throat ?Swelling of the ankles, hands, or feet ?Trouble breathing ?Side effects that usually do not require medical attention (report to your care team if they continue or are bothersome): ?Diarrhea ?This list may not describe all possible side effects. Call your doctor for medical advice about side effects. You may report side effects to FDA at 1-800-FDA-1088. ?Where should I keep my medication? ?Keep out of the reach of children. ?Store at room temperature between 15 and 30 degrees C (59 and 85 degrees F). Protect from light. Throw away any unused medication after the expiration date. ?NOTE: This sheet is a summary. It may not cover all possible information. If you have questions about this medicine, talk to your doctor, pharmacist, or health care provider. ?? 2022 Elsevier/Gold Standard (2020-04-04 00:00:00) ? ?

## 2021-05-08 ENCOUNTER — Ambulatory Visit: Payer: Medicare Other | Admitting: Oncology

## 2021-05-08 ENCOUNTER — Other Ambulatory Visit: Payer: Medicare Other

## 2021-05-11 ENCOUNTER — Encounter: Payer: Self-pay | Admitting: Oncology

## 2021-05-27 ENCOUNTER — Inpatient Hospital Stay: Payer: Medicare Other

## 2021-05-27 VITALS — BP 184/79 | HR 70 | Resp 18 | Ht 66.0 in | Wt 173.4 lb

## 2021-05-27 DIAGNOSIS — D631 Anemia in chronic kidney disease: Secondary | ICD-10-CM

## 2021-05-27 DIAGNOSIS — N185 Chronic kidney disease, stage 5: Secondary | ICD-10-CM | POA: Diagnosis not present

## 2021-05-27 MED ORDER — EPOETIN ALFA-EPBX 40000 UNIT/ML IJ SOLN
40000.0000 [IU] | Freq: Once | INTRAMUSCULAR | Status: AC
  Administered 2021-05-27: 40000 [IU] via SUBCUTANEOUS
  Filled 2021-05-27: qty 1

## 2021-05-27 NOTE — Patient Instructions (Signed)
Epoetin Alfa injection ?What is this medication? ?EPOETIN ALFA (e POE e tin AL fa) helps your body make more red blood cells. This medicine is used to treat anemia caused by chronic kidney disease, cancer chemotherapy, or HIV-therapy. It may also be used before surgery if you have anemia. ?This medicine may be used for other purposes; ask your health care provider or pharmacist if you have questions. ?COMMON BRAND NAME(S): Epogen, Procrit, Retacrit ?What should I tell my care team before I take this medication? ?They need to know if you have any of these conditions: ?cancer ?heart disease ?high blood pressure ?history of blood clots ?history of stroke ?low levels of folate, iron, or vitamin B12 in the blood ?seizures ?an unusual or allergic reaction to erythropoietin, albumin, benzyl alcohol, hamster proteins, other medicines, foods, dyes, or preservatives ?pregnant or trying to get pregnant ?breast-feeding ?How should I use this medication? ?This medicine is for injection into a vein or under the skin. It is usually given by a health care professional in a hospital or clinic setting. ?If you get this medicine at home, you will be taught how to prepare and give this medicine. Use exactly as directed. Take your medicine at regular intervals. Do not take your medicine more often than directed. ?It is important that you put your used needles and syringes in a special sharps container. Do not put them in a trash can. If you do not have a sharps container, call your pharmacist or healthcare provider to get one. ?A special MedGuide will be given to you by the pharmacist with each prescription and refill. Be sure to read this information carefully each time. ?Talk to your pediatrician regarding the use of this medicine in children. While this drug may be prescribed for selected conditions, precautions do apply. ?Overdosage: If you think you have taken too much of this medicine contact a poison control center or emergency  room at once. ?NOTE: This medicine is only for you. Do not share this medicine with others. ?What if I miss a dose? ?If you miss a dose, take it as soon as you can. If it is almost time for your next dose, take only that dose. Do not take double or extra doses. ?What may interact with this medication? ?Interactions have not been studied. ?This list may not describe all possible interactions. Give your health care provider a list of all the medicines, herbs, non-prescription drugs, or dietary supplements you use. Also tell them if you smoke, drink alcohol, or use illegal drugs. Some items may interact with your medicine. ?What should I watch for while using this medication? ?Your condition will be monitored carefully while you are receiving this medicine. ?You may need blood work done while you are taking this medicine. ?This medicine may cause a decrease in vitamin B6. You should make sure that you get enough vitamin B6 while you are taking this medicine. Discuss the foods you eat and the vitamins you take with your health care professional. ?What side effects may I notice from receiving this medication? ?Side effects that you should report to your doctor or health care professional as soon as possible: ?allergic reactions like skin rash, itching or hives, swelling of the face, lips, or tongue ?seizures ?signs and symptoms of a blood clot such as breathing problems; changes in vision; chest pain; severe, sudden headache; pain, swelling, warmth in the leg; trouble speaking; sudden numbness or weakness of the face, arm or leg ?signs and symptoms of a stroke like   changes in vision; confusion; trouble speaking or understanding; severe headaches; sudden numbness or weakness of the face, arm or leg; trouble walking; dizziness; loss of balance or coordination ?Side effects that usually do not require medical attention (report to your doctor or health care professional if they continue or are  bothersome): ?chills ?cough ?dizziness ?fever ?headaches ?joint pain ?muscle cramps ?muscle pain ?nausea, vomiting ?pain, redness, or irritation at site where injected ?This list may not describe all possible side effects. Call your doctor for medical advice about side effects. You may report side effects to FDA at 1-800-FDA-1088. ?Where should I keep my medication? ?Keep out of the reach of children. ?Store in a refrigerator between 2 and 8 degrees C (36 and 46 degrees F). Do not freeze or shake. Throw away any unused portion if using a single-dose vial. Multi-dose vials can be kept in the refrigerator for up to 21 days after the initial dose. Throw away unused medicine. ?NOTE: This sheet is a summary. It may not cover all possible information. If you have questions about this medicine, talk to your doctor, pharmacist, or health care provider. ?? 2023 Elsevier/Gold Standard (2016-09-23 00:00:00) ? ?

## 2021-06-07 ENCOUNTER — Other Ambulatory Visit: Payer: Medicare Other

## 2021-06-07 ENCOUNTER — Ambulatory Visit: Payer: Medicare Other | Admitting: Oncology

## 2021-06-17 ENCOUNTER — Inpatient Hospital Stay: Payer: Medicare Other | Attending: Oncology

## 2021-06-17 ENCOUNTER — Ambulatory Visit: Payer: Medicare Other

## 2021-06-17 ENCOUNTER — Inpatient Hospital Stay: Payer: Medicare Other

## 2021-06-17 VITALS — BP 190/81 | HR 64 | Temp 97.7°F | Resp 20 | Ht 66.0 in | Wt 172.2 lb

## 2021-06-17 DIAGNOSIS — D631 Anemia in chronic kidney disease: Secondary | ICD-10-CM | POA: Diagnosis not present

## 2021-06-17 DIAGNOSIS — N185 Chronic kidney disease, stage 5: Secondary | ICD-10-CM | POA: Diagnosis present

## 2021-06-17 LAB — BASIC METABOLIC PANEL
BUN: 87 — AB (ref 4–21)
CO2: 23 — AB (ref 13–22)
Chloride: 103 (ref 99–108)
Creatinine: 3.9 — AB (ref 0.5–1.1)
Glucose: 138
Potassium: 4.6 mEq/L (ref 3.5–5.1)
Sodium: 140 (ref 137–147)

## 2021-06-17 LAB — CBC AND DIFFERENTIAL
HCT: 29 — AB (ref 36–46)
Hemoglobin: 9.1 — AB (ref 12.0–16.0)
Neutrophils Absolute: 2.16
Platelets: 79 10*3/uL — AB (ref 150–400)
WBC: 3.6

## 2021-06-17 LAB — IRON AND TIBC
Iron: 69 ug/dL (ref 28–170)
Saturation Ratios: 21 % (ref 10.4–31.8)
TIBC: 323 ug/dL (ref 250–450)
UIBC: 254 ug/dL

## 2021-06-17 LAB — HEPATIC FUNCTION PANEL
ALT: 33 U/L (ref 7–35)
AST: 52 — AB (ref 13–35)
Alkaline Phosphatase: 101 (ref 25–125)
Bilirubin, Total: 0.6

## 2021-06-17 LAB — COMPREHENSIVE METABOLIC PANEL
Albumin: 4.2 (ref 3.5–5.0)
Calcium: 8.5 — AB (ref 8.7–10.7)

## 2021-06-17 LAB — FERRITIN: Ferritin: 338 ng/mL — ABNORMAL HIGH (ref 11–307)

## 2021-06-17 LAB — CBC: RBC: 3.6 — AB (ref 3.87–5.11)

## 2021-06-17 MED ORDER — EPOETIN ALFA-EPBX 40000 UNIT/ML IJ SOLN: 40000.0000 [IU] | Freq: Once | INTRAMUSCULAR | Status: DC

## 2021-06-17 NOTE — Progress Notes (Signed)
Ulice Dash, rph notified of pt's bp's....174/88, 189/81, and 190/81-pt did not take bp med this morning.  Denies neurological symptoms.  Pt rescheduled for wed-instructed to take bp med in am that day.  Pt verbalizes understanding.  Pt dc home.   ?

## 2021-06-18 ENCOUNTER — Encounter: Payer: Self-pay | Admitting: Oncology

## 2021-06-19 ENCOUNTER — Inpatient Hospital Stay: Payer: Medicare Other

## 2021-06-19 VITALS — BP 163/71 | HR 68 | Temp 97.9°F | Resp 18 | Ht 66.0 in | Wt 171.0 lb

## 2021-06-19 DIAGNOSIS — N185 Chronic kidney disease, stage 5: Secondary | ICD-10-CM | POA: Diagnosis not present

## 2021-06-19 DIAGNOSIS — D631 Anemia in chronic kidney disease: Secondary | ICD-10-CM

## 2021-06-19 MED ORDER — EPOETIN ALFA-EPBX 40000 UNIT/ML IJ SOLN
40000.0000 [IU] | Freq: Once | INTRAMUSCULAR | Status: AC
  Administered 2021-06-19: 40000 [IU] via SUBCUTANEOUS
  Filled 2021-06-19: qty 1

## 2021-06-19 NOTE — Patient Instructions (Signed)
Epoetin Alfa injection ?What is this medication? ?EPOETIN ALFA (e POE e tin AL fa) helps your body make more red blood cells. This medicine is used to treat anemia caused by chronic kidney disease, cancer chemotherapy, or HIV-therapy. It may also be used before surgery if you have anemia. ?This medicine may be used for other purposes; ask your health care provider or pharmacist if you have questions. ?COMMON BRAND NAME(S): Epogen, Procrit, Retacrit ?What should I tell my care team before I take this medication? ?They need to know if you have any of these conditions: ?cancer ?heart disease ?high blood pressure ?history of blood clots ?history of stroke ?low levels of folate, iron, or vitamin B12 in the blood ?seizures ?an unusual or allergic reaction to erythropoietin, albumin, benzyl alcohol, hamster proteins, other medicines, foods, dyes, or preservatives ?pregnant or trying to get pregnant ?breast-feeding ?How should I use this medication? ?This medicine is for injection into a vein or under the skin. It is usually given by a health care professional in a hospital or clinic setting. ?If you get this medicine at home, you will be taught how to prepare and give this medicine. Use exactly as directed. Take your medicine at regular intervals. Do not take your medicine more often than directed. ?It is important that you put your used needles and syringes in a special sharps container. Do not put them in a trash can. If you do not have a sharps container, call your pharmacist or healthcare provider to get one. ?A special MedGuide will be given to you by the pharmacist with each prescription and refill. Be sure to read this information carefully each time. ?Talk to your pediatrician regarding the use of this medicine in children. While this drug may be prescribed for selected conditions, precautions do apply. ?Overdosage: If you think you have taken too much of this medicine contact a poison control center or emergency  room at once. ?NOTE: This medicine is only for you. Do not share this medicine with others. ?What if I miss a dose? ?If you miss a dose, take it as soon as you can. If it is almost time for your next dose, take only that dose. Do not take double or extra doses. ?What may interact with this medication? ?Interactions have not been studied. ?This list may not describe all possible interactions. Give your health care provider a list of all the medicines, herbs, non-prescription drugs, or dietary supplements you use. Also tell them if you smoke, drink alcohol, or use illegal drugs. Some items may interact with your medicine. ?What should I watch for while using this medication? ?Your condition will be monitored carefully while you are receiving this medicine. ?You may need blood work done while you are taking this medicine. ?This medicine may cause a decrease in vitamin B6. You should make sure that you get enough vitamin B6 while you are taking this medicine. Discuss the foods you eat and the vitamins you take with your health care professional. ?What side effects may I notice from receiving this medication? ?Side effects that you should report to your doctor or health care professional as soon as possible: ?allergic reactions like skin rash, itching or hives, swelling of the face, lips, or tongue ?seizures ?signs and symptoms of a blood clot such as breathing problems; changes in vision; chest pain; severe, sudden headache; pain, swelling, warmth in the leg; trouble speaking; sudden numbness or weakness of the face, arm or leg ?signs and symptoms of a stroke like   changes in vision; confusion; trouble speaking or understanding; severe headaches; sudden numbness or weakness of the face, arm or leg; trouble walking; dizziness; loss of balance or coordination ?Side effects that usually do not require medical attention (report to your doctor or health care professional if they continue or are  bothersome): ?chills ?cough ?dizziness ?fever ?headaches ?joint pain ?muscle cramps ?muscle pain ?nausea, vomiting ?pain, redness, or irritation at site where injected ?This list may not describe all possible side effects. Call your doctor for medical advice about side effects. You may report side effects to FDA at 1-800-FDA-1088. ?Where should I keep my medication? ?Keep out of the reach of children. ?Store in a refrigerator between 2 and 8 degrees C (36 and 46 degrees F). Do not freeze or shake. Throw away any unused portion if using a single-dose vial. Multi-dose vials can be kept in the refrigerator for up to 21 days after the initial dose. Throw away unused medicine. ?NOTE: This sheet is a summary. It may not cover all possible information. If you have questions about this medicine, talk to your doctor, pharmacist, or health care provider. ?? 2023 Elsevier/Gold Standard (2016-09-23 00:00:00) ? ?

## 2021-06-27 ENCOUNTER — Other Ambulatory Visit: Payer: Self-pay

## 2021-06-27 DIAGNOSIS — N185 Chronic kidney disease, stage 5: Secondary | ICD-10-CM

## 2021-07-05 ENCOUNTER — Inpatient Hospital Stay: Payer: Medicare Other | Admitting: Oncology

## 2021-07-05 ENCOUNTER — Inpatient Hospital Stay: Payer: Medicare Other | Attending: Oncology

## 2021-07-05 VITALS — BP 145/67 | HR 71 | Temp 98.3°F | Resp 16 | Ht 66.0 in | Wt 175.1 lb

## 2021-07-05 DIAGNOSIS — D631 Anemia in chronic kidney disease: Secondary | ICD-10-CM | POA: Insufficient documentation

## 2021-07-05 DIAGNOSIS — D509 Iron deficiency anemia, unspecified: Secondary | ICD-10-CM | POA: Insufficient documentation

## 2021-07-05 DIAGNOSIS — N189 Chronic kidney disease, unspecified: Secondary | ICD-10-CM | POA: Insufficient documentation

## 2021-07-05 LAB — COMPREHENSIVE METABOLIC PANEL
Albumin: 4.2 (ref 3.5–5.0)
Calcium: 8.3 — AB (ref 8.7–10.7)

## 2021-07-05 LAB — IRON AND TIBC
Iron: 29 ug/dL (ref 28–170)
Saturation Ratios: 10 % — ABNORMAL LOW (ref 10.4–31.8)
TIBC: 304 ug/dL (ref 250–450)
UIBC: 275 ug/dL

## 2021-07-05 LAB — CBC AND DIFFERENTIAL
HCT: 28 — AB (ref 36–46)
Hemoglobin: 8.6 — AB (ref 12.0–16.0)
Neutrophils Absolute: 1.8
Platelets: 85 10*3/uL — AB (ref 150–400)
WBC: 3.4

## 2021-07-05 LAB — BASIC METABOLIC PANEL
BUN: 79 — AB (ref 4–21)
CO2: 26 — AB (ref 13–22)
Chloride: 103 (ref 99–108)
Creatinine: 4.2 — AB (ref 0.5–1.1)
Glucose: 154
Potassium: 4.7 mEq/L (ref 3.5–5.1)
Sodium: 140 (ref 137–147)

## 2021-07-05 LAB — HEPATIC FUNCTION PANEL
ALT: 34 U/L (ref 7–35)
AST: 47 — AB (ref 13–35)
Alkaline Phosphatase: 99 (ref 25–125)
Bilirubin, Total: 0.6

## 2021-07-05 LAB — FERRITIN: Ferritin: 223 ng/mL (ref 11–307)

## 2021-07-05 LAB — CBC: RBC: 3.45 — AB (ref 3.87–5.11)

## 2021-07-05 NOTE — Progress Notes (Signed)
[facility]  [address] [place],  [place]  [address] [phone]  Clinic Day:  05/03/2021  Referring physician: Maris Berger, MD  HISTORY OF PRESENT ILLNESS:  The patient is a 71 y.o. female with anemia secondary to chronic renal insufficiency and previous iron deficiency.  She comes in today to reassess her labs after receiving monthly Retacrit shots over these past few months.  Since her last visit, the patient has been doing okay.  She still has a moderate degree of fatigue on a daily basis.  However, she denies having any overt forms of blood loss.    PHYSICAL EXAM:  Blood pressure (!) 145/67, pulse 71, temperature 98.3 F (36.8 C), resp. rate 16, height 5\' 6"  (1.676 m), weight 175 lb 1.6 oz (79.4 kg), SpO2 93 %. Wt Readings from Last 3 Encounters:  07/08/21 174 lb (78.9 kg)  07/05/21 175 lb 1.6 oz (79.4 kg)  06/19/21 171 lb (77.6 kg)   Body mass index is 28.26 kg/m. Performance status (ECOG): 1 - Symptomatic but completely ambulatory Physical Exam Constitutional:      General: She is not in acute distress.    Appearance: Normal appearance. She is normal weight.  HENT:     Head: Normocephalic and atraumatic.  Eyes:     General: No scleral icterus.    Extraocular Movements: Extraocular movements intact.     Conjunctiva/sclera: Conjunctivae normal.     Pupils: Pupils are equal, round, and reactive to light.  Cardiovascular:     Rate and Rhythm: Normal rate and regular rhythm.     Pulses: Normal pulses.     Heart sounds: Normal heart sounds. No murmur heard.    No friction rub. No gallop.  Pulmonary:     Effort: Pulmonary effort is normal. No respiratory distress.     Breath sounds: Normal breath sounds.  Abdominal:     General: Bowel sounds are normal. There is no distension.     Palpations: Abdomen is soft. There is no hepatomegaly, splenomegaly or mass.     Tenderness: There is no abdominal tenderness.  Musculoskeletal:         General: Normal range of motion.     Cervical back: Normal range of motion and neck supple.     Right lower leg: No edema.     Left lower leg: No edema.  Lymphadenopathy:     Cervical: No cervical adenopathy.  Skin:    General: Skin is warm and dry.  Neurological:     General: No focal deficit present.     Mental Status: She is alert and oriented to person, place, and time. Mental status is at baseline.  Psychiatric:        Mood and Affect: Mood normal.        Behavior: Behavior normal.        Thought Content: Thought content normal.        Judgment: Judgment normal.    LABS:      Latest Reference Range & Units Most Recent 06/17/21 10:28  Iron 28 - 170 ug/dL 29 07/05/21 13:42 69  UIBC ug/dL 275 07/05/21 13:42 254  TIBC 250 - 450 ug/dL 304 07/05/21 13:42 323  %SAT  23.3 (E) 04/06/20 00:00   Saturation Ratios 10.4 - 31.8 % 10 (L) 07/05/21 13:42 21  Ferritin 11 - 307 ng/mL 223 07/05/21 13:42 338 (H)  (L): Data is abnormally low (H): Data is abnormally high (E): External lab result  ASSESSMENT &  PLAN:  Assessment/Plan:  A 71 y.o. female with anemia secondary to chronic renal insufficiency, as well as a component of iron deficiency.  Despite receiving Retacrit injections over these past few months, this patient's hemoglobin has fallen.  Furthermore, her iron parameters are lower than what they were previously.  Based upon this, I will take a two-pronged approach with respect to her anemia.  I will first give her IV iron over these next few weeks to rapidly replenish her iron stores.  Afterwards, I will give her Retacrit injections every 3 weeks.  The hope is that the iron infusion followed by a series of Retacrit injections will lead to a more brisk rise in her hemoglobin, as well as an improvement in her iron parameters.  I will see this patient back in approximately 3 months to see how well she responded to the sequential combination of IV iron and Retacrit injections.  The patient  understands all the plans discussed today and is in agreement with them.  Zebulen Simonis Macarthur Critchley, MD

## 2021-07-08 ENCOUNTER — Ambulatory Visit (HOSPITAL_COMMUNITY)
Admission: RE | Admit: 2021-07-08 | Discharge: 2021-07-08 | Disposition: A | Discharge status: Home / Self Care | Payer: Medicare Other | Source: Ambulatory Visit | Attending: Surgery | Admitting: Surgery

## 2021-07-08 ENCOUNTER — Ambulatory Visit: Payer: Medicare Other | Admitting: Oncology

## 2021-07-08 ENCOUNTER — Other Ambulatory Visit: Payer: Self-pay | Admitting: Pharmacist

## 2021-07-08 ENCOUNTER — Encounter: Payer: Self-pay | Admitting: Surgery

## 2021-07-08 ENCOUNTER — Other Ambulatory Visit: Payer: Medicare Other

## 2021-07-08 ENCOUNTER — Ambulatory Visit: Payer: Medicare Other | Admitting: Surgery

## 2021-07-08 VITALS — BP 163/76 | HR 62 | Temp 97.9°F | Resp 20 | Ht 66.0 in | Wt 174.0 lb

## 2021-07-08 DIAGNOSIS — N185 Chronic kidney disease, stage 5: Secondary | ICD-10-CM | POA: Diagnosis not present

## 2021-07-08 NOTE — H&P (View-Only) (Signed)
[facility]  Patient name: Hannah [name] MRN: [id number] DOB: [birth year] Sex: female   REQUESTING PROVIDER:    Dr. Candiss Norse   REASON FOR CONSULT:    Dialysis access  HISTORY OF PRESENT ILLNESS:   Hannah [name] is a 71 y.o. female, who is here today for dialysis access.  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  She is right-handed.  Her renal failure secondary to diabetes.  She does not have a pacemaker.  PAST MEDICAL HISTORY    Past Medical History:  Diagnosis Date   Anemia    Anemia, unspecified    Chronic kidney disease    Diabetes mellitus without complication ([place])      FAMILY HISTORY   Family History  Problem Relation Age of Onset   Diabetes Mother    Heart disease Mother    Hypertension Mother    Diabetes Sister    Heart disease Sister    Hypertension Sister    Hypertension Brother     SOCIAL HISTORY:   Social History   Socioeconomic History   Marital status: Married    Spouse name: Not on file   Number of children: Not on file   Years of education: Not on file   Highest education level: Not on file  Occupational History   Not on file  Tobacco Use   Smoking status: Never   Smokeless tobacco: Never  Substance and Sexual Activity   Alcohol use: No    Alcohol/week: 0.0 standard drinks   Drug use: No   Sexual activity: Not on file  Other Topics Concern   Not on file  Social History Narrative   Not on file   Social Determinants of Health   Financial Resource Strain: Not on file  Food Insecurity: Not on file  Transportation Needs: Not on file  Physical Activity: Not on file  Stress: Not on file  Social Connections: Not on file  Intimate Partner Violence: Not on file    ALLERGIES:    No Known Allergies  CURRENT MEDICATIONS:    Current Outpatient Medications  Medication Sig Dispense Refill   amLODipine (NORVASC) 10 MG tablet Take 1 tablet by mouth daily.      aspirin EC 81 MG tablet Take 81 mg by mouth daily. Swallow whole.     Blood Glucose Monitoring Suppl (FIFTY50 GLUCOSE METER 2.0) w/Device KIT Use as instructed Dx E11.9     calcitRIOL (ROCALTROL) 0.25 MCG capsule Take 0.25 mcg by mouth daily.     calcium acetate (PHOSLO) 667 MG capsule Take by mouth.     carvedilol (COREG) 25 MG tablet Take 25 mg by mouth daily.     cilostazol (PLETAL) 50 MG tablet Take 50 mg by mouth 2 (two) times daily.     colchicine 0.6 MG tablet Take 0.6 mg by mouth daily.     Darbepoetin Alfa (ARANESP) 100 MCG/0.5ML SOSY injection Inject into the skin.     DULoxetine (CYMBALTA) 30 MG capsule Take 1 capsule by mouth daily.     febuxostat (ULORIC) 40 MG tablet Take 40 mg by mouth daily.     ferrous sulfate 325 (65 FE) MG EC tablet Take 325 mg by mouth 3 (three) times daily with meals.     furosemide (LASIX) 40 MG tablet Take 40 mg by mouth.     glucose blood (ACCU-CHEK AVIVA PLUS) test strip Use to check sugar two times daily DM2 E11.9 no insulin  insulin NPH-regular Human (70-30) 100 UNIT/ML injection Inject into the skin.     Insulin Syringe-Needle U-100 (INSULIN SYRINGE 1CC/30GX5/16") 30G X 5/16" 1 ML MISC 1 twice daily for E11.9 DM2 on insulin     Insulin Syringe-Needle U-100 31G X 5/16" 1 ML MISC USE 1 SYRINGE TWICE DAILY WITH INSULIN     irbesartan (AVAPRO) 150 MG tablet Take 150 mg by mouth daily.     labetalol (NORMODYNE) 200 MG tablet Take by mouth.     latanoprost (XALATAN) 0.005 % ophthalmic solution 1 drop at bedtime.     pravastatin (PRAVACHOL) 40 MG tablet Take 40 mg by mouth daily.     timolol (TIMOPTIC) 0.25 % ophthalmic solution      No current facility-administered medications for this visit.    REVIEW OF SYSTEMS:   [X]  denotes positive finding, [ ]  denotes negative finding Cardiac  Comments:  Chest pain or chest pressure:    Shortness of breath upon exertion:    Short of breath when lying flat:    Irregular heart rhythm:         Vascular    Pain in calf, thigh, or hip brought on by ambulation:    Pain in feet at night that wakes you up from your sleep:     Blood clot in your veins:    Leg swelling:         Pulmonary    Oxygen at home:    Productive cough:     Wheezing:         Neurologic    Sudden weakness in arms or legs:     Sudden numbness in arms or legs:     Sudden onset of difficulty speaking or slurred speech:    Temporary loss of vision in one eye:     Problems with dizziness:         Gastrointestinal    Blood in stool:      Vomited blood:         Genitourinary    Burning when urinating:     Blood in urine:        Psychiatric    Major depression:         Hematologic    Bleeding problems:    Problems with blood clotting too easily:        Skin    Rashes or ulcers:        Constitutional    Fever or chills:     PHYSICAL EXAM:   Vitals:   07/08/21 1350  BP: (!) 163/76  Pulse: 62  Resp: 20  Temp: 97.9 F (36.6 C)  SpO2: 95%  Weight: 174 lb (78.9 kg)  Height: 5' 6"  (1.676 m)    GENERAL: The patient is a well-nourished female, in no acute distress. The vital signs are documented above. CARDIAC: There is a regular rate and rhythm.  VASCULAR: Palpable left radial pulse PULMONARY: Nonlabored respirations MUSCULOSKELETAL: There are no major deformities or cyanosis. NEUROLOGIC: No focal weakness or paresthesias are detected. SKIN: There are no ulcers or rashes noted. PSYCHIATRIC: The patient has a normal affect.  STUDIES:   I have reviewed the following: +-----------------+-------------+----------+--------+  Right Cephalic   Diameter (cm)Depth (cm)Findings  +-----------------+-------------+----------+--------+  Shoulder             0.25                         +-----------------+-------------+----------+--------+  Prox upper arm  0.22                         +-----------------+-------------+----------+--------+  Mid upper arm        0.24                          +-----------------+-------------+----------+--------+  Dist upper arm       0.19                         +-----------------+-------------+----------+--------+  Antecubital fossa    0.47                         +-----------------+-------------+----------+--------+  Prox forearm         0.22                         +-----------------+-------------+----------+--------+  Mid forearm          0.22                         +-----------------+-------------+----------+--------+  Dist forearm         0.24                         +-----------------+-------------+----------+--------+   +-----------------+-------------+----------+--------+  Right Basilic    Diameter (cm)Depth (cm)Findings  +-----------------+-------------+----------+--------+  Mid upper arm        0.20                         +-----------------+-------------+----------+--------+  Dist upper arm       0.17                         +-----------------+-------------+----------+--------+  Antecubital fossa    0.21                         +-----------------+-------------+----------+--------+   +-----------------+-------------+----------+--------+  Left Cephalic    Diameter (cm)Depth (cm)Findings  +-----------------+-------------+----------+--------+  Shoulder             0.24                         +-----------------+-------------+----------+--------+  Prox upper arm       0.19                         +-----------------+-------------+----------+--------+  Mid upper arm        0.16                         +-----------------+-------------+----------+--------+  Dist upper arm       0.18                         +-----------------+-------------+----------+--------+  Antecubital fossa    0.48                         +-----------------+-------------+----------+--------+  Prox forearm         0.18                          +-----------------+-------------+----------+--------+  Mid forearm  0.18                         +-----------------+-------------+----------+--------+  Dist forearm         0.19                         +-----------------+-------------+----------+--------+   +-----------------+-------------+----------+--------+  Left Basilic     Diameter (cm)Depth (cm)Findings  +-----------------+-------------+----------+--------+  Mid upper arm        0.19                         +-----------------+-------------+----------+--------+  Dist upper arm       0.12                         +-----------------+-------------+----------+--------+  Antecubital fossa    0.16                         +-----------------+-------------+----------+--------+  ASSESSMENT and PLAN   CKD 5: I discussed with the patient that based off imaging today, her surface veins appear to be too small to use for dialysis.  Therefore she will more than likely require a left-sided graft, likely in the upper arm, however I will evaluate her surface veins to make sure that she does not have options for fistula.  I discussed the risks of not maturity, the need for future interventions, and the risk of steal.  All questions were answered.  We will need to get this done in the future as she is quickly approaching the need for renal replacement therapy.   Leia Alf, MD, FACS [facility] [phone] Pager [phone]

## 2021-07-08 NOTE — Progress Notes (Signed)
[facility] [place]  Patient name: Hannah [name] MRN: [id number] DOB: [birth year] Sex: female   REQUESTING PROVIDER:    Dr. Candiss Norse   REASON FOR CONSULT:    Dialysis access  HISTORY OF PRESENT ILLNESS:   Hannah [name] is a 71 y.o. female, who is here today for dialysis access.  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  She is right-handed.  Her renal failure secondary to diabetes.  She does not have a pacemaker.  PAST MEDICAL HISTORY    Past Medical History:  Diagnosis Date   Anemia    Anemia, unspecified    Chronic kidney disease    Diabetes mellitus without complication ([place])      FAMILY HISTORY   Family History  Problem Relation Age of Onset   Diabetes Mother    Heart disease Mother    Hypertension Mother    Diabetes Sister    Heart disease Sister    Hypertension Sister    Hypertension Brother     SOCIAL HISTORY:   Social History   Socioeconomic History   Marital status: Married    Spouse name: Not on file   Number of children: Not on file   Years of education: Not on file   Highest education level: Not on file  Occupational History   Not on file  Tobacco Use   Smoking status: Never   Smokeless tobacco: Never  Substance and Sexual Activity   Alcohol use: No    Alcohol/week: 0.0 standard drinks   Drug use: No   Sexual activity: Not on file  Other Topics Concern   Not on file  Social History Narrative   Not on file   Social Determinants of Health   Financial Resource Strain: Not on file  Food Insecurity: Not on file  Transportation Needs: Not on file  Physical Activity: Not on file  Stress: Not on file  Social Connections: Not on file  Intimate Partner Violence: Not on file    ALLERGIES:    No Known Allergies  CURRENT MEDICATIONS:    Current Outpatient Medications  Medication Sig Dispense Refill   amLODipine (NORVASC) 10 MG tablet Take 1 tablet by mouth daily.      aspirin EC 81 MG tablet Take 81 mg by mouth daily. Swallow whole.     Blood Glucose Monitoring Suppl (FIFTY50 GLUCOSE METER 2.0) w/Device KIT Use as instructed Dx E11.9     calcitRIOL (ROCALTROL) 0.25 MCG capsule Take 0.25 mcg by mouth daily.     calcium acetate (PHOSLO) 667 MG capsule Take by mouth.     carvedilol (COREG) 25 MG tablet Take 25 mg by mouth daily.     cilostazol (PLETAL) 50 MG tablet Take 50 mg by mouth 2 (two) times daily.     colchicine 0.6 MG tablet Take 0.6 mg by mouth daily.     Darbepoetin Alfa (ARANESP) 100 MCG/0.5ML SOSY injection Inject into the skin.     DULoxetine (CYMBALTA) 30 MG capsule Take 1 capsule by mouth daily.     febuxostat (ULORIC) 40 MG tablet Take 40 mg by mouth daily.     ferrous sulfate 325 (65 FE) MG EC tablet Take 325 mg by mouth 3 (three) times daily with meals.     furosemide (LASIX) 40 MG tablet Take 40 mg by mouth.     glucose blood (ACCU-CHEK AVIVA PLUS) test strip Use to check sugar two times daily DM2 E11.9 no insulin  insulin NPH-regular Human (70-30) 100 UNIT/ML injection Inject into the skin.     Insulin Syringe-Needle U-100 (INSULIN SYRINGE 1CC/30GX5/16") 30G X 5/16" 1 ML MISC 1 twice daily for E11.9 DM2 on insulin     Insulin Syringe-Needle U-100 31G X 5/16" 1 ML MISC USE 1 SYRINGE TWICE DAILY WITH INSULIN     irbesartan (AVAPRO) 150 MG tablet Take 150 mg by mouth daily.     labetalol (NORMODYNE) 200 MG tablet Take by mouth.     latanoprost (XALATAN) 0.005 % ophthalmic solution 1 drop at bedtime.     pravastatin (PRAVACHOL) 40 MG tablet Take 40 mg by mouth daily.     timolol (TIMOPTIC) 0.25 % ophthalmic solution      No current facility-administered medications for this visit.    REVIEW OF SYSTEMS:   [X]  denotes positive finding, [ ]  denotes negative finding Cardiac  Comments:  Chest pain or chest pressure:    Shortness of breath upon exertion:    Short of breath when lying flat:    Irregular heart rhythm:         Vascular    Pain in calf, thigh, or hip brought on by ambulation:    Pain in feet at night that wakes you up from your sleep:     Blood clot in your veins:    Leg swelling:         Pulmonary    Oxygen at home:    Productive cough:     Wheezing:         Neurologic    Sudden weakness in arms or legs:     Sudden numbness in arms or legs:     Sudden onset of difficulty speaking or slurred speech:    Temporary loss of vision in one eye:     Problems with dizziness:         Gastrointestinal    Blood in stool:      Vomited blood:         Genitourinary    Burning when urinating:     Blood in urine:        Psychiatric    Major depression:         Hematologic    Bleeding problems:    Problems with blood clotting too easily:        Skin    Rashes or ulcers:        Constitutional    Fever or chills:     PHYSICAL EXAM:   Vitals:   07/08/21 1350  BP: (!) 163/76  Pulse: 62  Resp: 20  Temp: 97.9 F (36.6 C)  SpO2: 95%  Weight: 174 lb (78.9 kg)  Height: 5' 6"  (1.676 m)    GENERAL: The patient is a well-nourished female, in no acute distress. The vital signs are documented above. CARDIAC: There is a regular rate and rhythm.  VASCULAR: Palpable left radial pulse PULMONARY: Nonlabored respirations MUSCULOSKELETAL: There are no major deformities or cyanosis. NEUROLOGIC: No focal weakness or paresthesias are detected. SKIN: There are no ulcers or rashes noted. PSYCHIATRIC: The patient has a normal affect.  STUDIES:   I have reviewed the following: +-----------------+-------------+----------+--------+  Right Cephalic   Diameter (cm)Depth (cm)Findings  +-----------------+-------------+----------+--------+  Shoulder             0.25                         +-----------------+-------------+----------+--------+  Prox upper arm  0.22                         +-----------------+-------------+----------+--------+  Mid upper arm        0.24                          +-----------------+-------------+----------+--------+  Dist upper arm       0.19                         +-----------------+-------------+----------+--------+  Antecubital fossa    0.47                         +-----------------+-------------+----------+--------+  Prox forearm         0.22                         +-----------------+-------------+----------+--------+  Mid forearm          0.22                         +-----------------+-------------+----------+--------+  Dist forearm         0.24                         +-----------------+-------------+----------+--------+   +-----------------+-------------+----------+--------+  Right Basilic    Diameter (cm)Depth (cm)Findings  +-----------------+-------------+----------+--------+  Mid upper arm        0.20                         +-----------------+-------------+----------+--------+  Dist upper arm       0.17                         +-----------------+-------------+----------+--------+  Antecubital fossa    0.21                         +-----------------+-------------+----------+--------+   +-----------------+-------------+----------+--------+  Left Cephalic    Diameter (cm)Depth (cm)Findings  +-----------------+-------------+----------+--------+  Shoulder             0.24                         +-----------------+-------------+----------+--------+  Prox upper arm       0.19                         +-----------------+-------------+----------+--------+  Mid upper arm        0.16                         +-----------------+-------------+----------+--------+  Dist upper arm       0.18                         +-----------------+-------------+----------+--------+  Antecubital fossa    0.48                         +-----------------+-------------+----------+--------+  Prox forearm         0.18                          +-----------------+-------------+----------+--------+  Mid forearm  0.18                         +-----------------+-------------+----------+--------+  Dist forearm         0.19                         +-----------------+-------------+----------+--------+   +-----------------+-------------+----------+--------+  Left Basilic     Diameter (cm)Depth (cm)Findings  +-----------------+-------------+----------+--------+  Mid upper arm        0.19                         +-----------------+-------------+----------+--------+  Dist upper arm       0.12                         +-----------------+-------------+----------+--------+  Antecubital fossa    0.16                         +-----------------+-------------+----------+--------+  ASSESSMENT and PLAN   CKD 5: I discussed with the patient that based off imaging today, her surface veins appear to be too small to use for dialysis.  Therefore she will more than likely require a left-sided graft, likely in the upper arm, however I will evaluate her surface veins to make sure that she does not have options for fistula.  I discussed the risks of not maturity, the need for future interventions, and the risk of steal.  All questions were answered.  We will need to get this done in the future as she is quickly approaching the need for renal replacement therapy.   Leia Alf, MD, FACS [facility] [phone] Pager [phone]

## 2021-07-09 ENCOUNTER — Ambulatory Visit: Payer: Medicare Other

## 2021-07-09 ENCOUNTER — Other Ambulatory Visit: Payer: Self-pay | Admitting: Pharmacist

## 2021-07-09 ENCOUNTER — Other Ambulatory Visit: Payer: Self-pay

## 2021-07-09 DIAGNOSIS — N185 Chronic kidney disease, stage 5: Secondary | ICD-10-CM

## 2021-07-09 MED FILL — Ferumoxytol Inj 510 MG/17ML (30 MG/ML) (Elemental Fe): INTRAVENOUS | Qty: 17 | Status: AC

## 2021-07-10 ENCOUNTER — Inpatient Hospital Stay: Payer: Medicare Other

## 2021-07-10 ENCOUNTER — Other Ambulatory Visit: Payer: Medicare Other

## 2021-07-10 ENCOUNTER — Ambulatory Visit: Payer: Medicare Other

## 2021-07-10 VITALS — BP 163/69 | HR 65 | Temp 97.7°F | Resp 18

## 2021-07-10 DIAGNOSIS — D631 Anemia in chronic kidney disease: Secondary | ICD-10-CM

## 2021-07-10 DIAGNOSIS — D509 Iron deficiency anemia, unspecified: Secondary | ICD-10-CM | POA: Diagnosis not present

## 2021-07-10 MED ORDER — SODIUM CHLORIDE 0.9 % IV SOLN: Freq: Once | INTRAVENOUS | Status: AC

## 2021-07-10 MED ORDER — SODIUM CHLORIDE 0.9 % IV SOLN
510.0000 mg | Freq: Once | INTRAVENOUS | Status: AC
  Administered 2021-07-10: 510 mg via INTRAVENOUS
  Filled 2021-07-10: qty 510

## 2021-07-10 NOTE — Patient Instructions (Signed)

## 2021-07-11 ENCOUNTER — Encounter: Payer: Self-pay | Admitting: Oncology

## 2021-07-16 ENCOUNTER — Encounter (HOSPITAL_COMMUNITY): Payer: Self-pay | Admitting: Surgery

## 2021-07-16 ENCOUNTER — Other Ambulatory Visit: Payer: Self-pay

## 2021-07-16 ENCOUNTER — Ambulatory Visit: Payer: Medicare Other

## 2021-07-16 NOTE — Progress Notes (Signed)
PCP - Dr Salvadore Oxford Cardiologist - n/a Oncology - Dr Lavera Guise  Chest x-ray - n/a EKG - DOS Stress Test - n/a ECHO - n/a Cardiac Cath - n/a  ICD Pacemaker/Loop - n/a  Sleep Study -  n/a CPAP - none  Diabetes - Patient has a Hotel manager.Sensor located on right arm. (Time to replace per patient)      THE MORNING OF SURGERY, do not take your NPH 70/30 Insulin.    If your blood sugar is less than 70 mg/dL, you will need to treat for low blood sugar: Treat a low blood sugar (less than 70 mg/dL) with  cup of clear juice (cranberry or apple), 4 glucose tablets, OR glucose gel. Recheck blood sugar in 15 minutes after treatment (to make sure it is greater than 70 mg/dL). If your blood sugar is not greater than 70 mg/dL on recheck, call [phone] for further instructions.  Last dose of Pletal was on 07/12/21.  Anesthesia review: Yes  STOP now taking any Aspirin (unless otherwise instructed by your surgeon), Aleve, Naproxen, Ibuprofen, Motrin, Advil, Goody's, BC's, all herbal medications, fish oil, and all vitamins.   Coronavirus Screening Do you have any of the following symptoms:  Cough yes/no: No Fever (>100.72F)  yes/no: No Runny nose yes/no: No Sore throat yes/no: No Difficulty breathing/shortness of breath  yes/no: No  Have you traveled in the last 14 days and where? yes/no: No  Patient verbalized understanding of instructions that were given via phone.

## 2021-07-17 ENCOUNTER — Encounter (HOSPITAL_COMMUNITY)
Admission: RE | Disposition: A | Discharge status: Home / Self Care | Payer: Self-pay | Source: Home / Self Care | Attending: Surgery

## 2021-07-17 ENCOUNTER — Ambulatory Visit (HOSPITAL_COMMUNITY)
Admission: RE | Admit: 2021-07-17 | Discharge: 2021-07-17 | Disposition: A | Discharge status: Home / Self Care | Payer: Medicare Other | Attending: Surgery | Admitting: Surgery

## 2021-07-17 ENCOUNTER — Ambulatory Visit (HOSPITAL_BASED_OUTPATIENT_CLINIC_OR_DEPARTMENT_OTHER): Payer: Medicare Other | Admitting: Physician Assistant

## 2021-07-17 ENCOUNTER — Encounter (HOSPITAL_COMMUNITY): Payer: Self-pay | Admitting: Surgery

## 2021-07-17 ENCOUNTER — Other Ambulatory Visit: Payer: Self-pay

## 2021-07-17 ENCOUNTER — Ambulatory Visit (HOSPITAL_COMMUNITY): Payer: Medicare Other | Admitting: Physician Assistant

## 2021-07-17 ENCOUNTER — Ambulatory Visit: Payer: Medicare Other

## 2021-07-17 DIAGNOSIS — E1122 Type 2 diabetes mellitus with diabetic chronic kidney disease: Secondary | ICD-10-CM | POA: Insufficient documentation

## 2021-07-17 DIAGNOSIS — D759 Disease of blood and blood-forming organs, unspecified: Secondary | ICD-10-CM | POA: Diagnosis not present

## 2021-07-17 DIAGNOSIS — D649 Anemia, unspecified: Secondary | ICD-10-CM | POA: Insufficient documentation

## 2021-07-17 DIAGNOSIS — Z794 Long term (current) use of insulin: Secondary | ICD-10-CM | POA: Insufficient documentation

## 2021-07-17 DIAGNOSIS — I10 Essential (primary) hypertension: Secondary | ICD-10-CM | POA: Insufficient documentation

## 2021-07-17 DIAGNOSIS — N186 End stage renal disease: Secondary | ICD-10-CM | POA: Insufficient documentation

## 2021-07-17 DIAGNOSIS — I120 Hypertensive chronic kidney disease with stage 5 chronic kidney disease or end stage renal disease: Secondary | ICD-10-CM

## 2021-07-17 DIAGNOSIS — N185 Chronic kidney disease, stage 5: Secondary | ICD-10-CM

## 2021-07-17 HISTORY — PX: AV FISTULA PLACEMENT: SHX1204

## 2021-07-17 LAB — CBC
HCT: 28.5 % — ABNORMAL LOW (ref 36.0–46.0)
Hemoglobin: 8.8 g/dL — ABNORMAL LOW (ref 12.0–15.0)
MCH: 26.2 pg (ref 26.0–34.0)
MCHC: 30.9 g/dL (ref 30.0–36.0)
MCV: 84.8 fL (ref 80.0–100.0)
Platelets: 105 10*3/uL — ABNORMAL LOW (ref 150–400)
RBC: 3.36 MIL/uL — ABNORMAL LOW (ref 3.87–5.11)
RDW: 17.5 % — ABNORMAL HIGH (ref 11.5–15.5)
WBC: 3.2 10*3/uL — ABNORMAL LOW (ref 4.0–10.5)
nRBC: 0 % (ref 0.0–0.2)

## 2021-07-17 LAB — BASIC METABOLIC PANEL
Anion gap: 6 (ref 5–15)
BUN: 87 mg/dL — ABNORMAL HIGH (ref 8–23)
CO2: 24 mmol/L (ref 22–32)
Calcium: 8.7 mg/dL — ABNORMAL LOW (ref 8.9–10.3)
Chloride: 110 mmol/L (ref 98–111)
Creatinine, Ser: 4.29 mg/dL — ABNORMAL HIGH (ref 0.44–1.00)
GFR, Estimated: 11 mL/min — ABNORMAL LOW (ref >60)
Glucose, Bld: 73 mg/dL (ref 70–99)
Potassium: 4.9 mmol/L (ref 3.5–5.1)
Sodium: 140 mmol/L (ref 135–145)

## 2021-07-17 LAB — GLUCOSE, CAPILLARY
Glucose-Capillary: 88 mg/dL (ref 70–99)
Glucose-Capillary: 81 mg/dL (ref 70–99)
Glucose-Capillary: 102 mg/dL — ABNORMAL HIGH (ref 70–99)

## 2021-07-17 SURGERY — ARTERIOVENOUS (AV) FISTULA CREATION
Anesthesia: Regional | Site: Arm Upper | Laterality: Left

## 2021-07-17 MED ORDER — FENTANYL CITRATE (PF) 250 MCG/5ML IJ SOLN
INTRAMUSCULAR | Status: AC
  Filled 2021-07-17: qty 5

## 2021-07-17 MED ORDER — MIDAZOLAM HCL 2 MG/2ML IJ SOLN: 1.0000 mg | Freq: Once | INTRAMUSCULAR | Status: AC

## 2021-07-17 MED ORDER — ORAL CARE MOUTH RINSE: 15.0000 mL | Freq: Once | OROMUCOSAL | Status: AC

## 2021-07-17 MED ORDER — LIDOCAINE-EPINEPHRINE (PF) 1.5 %-1:200000 IJ SOLN
INTRAMUSCULAR | Status: DC | PRN
  Administered 2021-07-17: 30 mL via PERINEURAL

## 2021-07-17 MED ORDER — CHLORHEXIDINE GLUCONATE 4 % EX LIQD: 60.0000 mL | Freq: Once | CUTANEOUS | Status: DC

## 2021-07-17 MED ORDER — LIDOCAINE HCL (PF) 1 % IJ SOLN
INTRAMUSCULAR | Status: AC
  Filled 2021-07-17: qty 30

## 2021-07-17 MED ORDER — INSULIN ASPART 100 UNIT/ML IJ SOLN: 0.0000 [IU] | INTRAMUSCULAR | Status: DC | PRN

## 2021-07-17 MED ORDER — PROPOFOL 500 MG/50ML IV EMUL
INTRAVENOUS | Status: DC | PRN
  Administered 2021-07-17: 50 ug/kg/min via INTRAVENOUS

## 2021-07-17 MED ORDER — HEPARIN 6000 UNIT IRRIGATION SOLUTION
Status: DC | PRN
  Administered 2021-07-17: 1

## 2021-07-17 MED ORDER — MIDAZOLAM HCL 2 MG/2ML IJ SOLN
INTRAMUSCULAR | Status: AC
  Administered 2021-07-17: 1 mg via INTRAVENOUS
  Filled 2021-07-17: qty 2

## 2021-07-17 MED ORDER — CEFAZOLIN SODIUM-DEXTROSE 2-4 GM/100ML-% IV SOLN
2.0000 g | INTRAVENOUS | Status: AC
  Administered 2021-07-17: 2 g via INTRAVENOUS
  Filled 2021-07-17: qty 100

## 2021-07-17 MED ORDER — FENTANYL CITRATE (PF) 100 MCG/2ML IJ SOLN
INTRAMUSCULAR | Status: AC
  Administered 2021-07-17: 50 ug via INTRAVENOUS
  Filled 2021-07-17: qty 2

## 2021-07-17 MED ORDER — SODIUM CHLORIDE 0.9 % IV SOLN: INTRAVENOUS | Status: DC

## 2021-07-17 MED ORDER — HEMOSTATIC AGENTS (NO CHARGE) OPTIME
TOPICAL | Status: DC | PRN
  Administered 2021-07-17: 1 via TOPICAL

## 2021-07-17 MED ORDER — CHLORHEXIDINE GLUCONATE 0.12 % MT SOLN
15.0000 mL | Freq: Once | OROMUCOSAL | Status: AC
  Administered 2021-07-17: 15 mL via OROMUCOSAL
  Filled 2021-07-17: qty 15

## 2021-07-17 MED ORDER — FENTANYL CITRATE (PF) 100 MCG/2ML IJ SOLN: 50.0000 ug | Freq: Once | INTRAMUSCULAR | Status: AC

## 2021-07-17 MED ORDER — PROPOFOL 1000 MG/100ML IV EMUL
INTRAVENOUS | Status: AC
  Filled 2021-07-17: qty 100

## 2021-07-17 MED ORDER — 0.9 % SODIUM CHLORIDE (POUR BTL) OPTIME
TOPICAL | Status: DC | PRN
  Administered 2021-07-17: 1000 mL

## 2021-07-17 MED ORDER — HEPARIN 6000 UNIT IRRIGATION SOLUTION
Status: AC
  Filled 2021-07-17: qty 500

## 2021-07-17 MED ORDER — OXYCODONE-ACETAMINOPHEN 5-325 MG PO TABS: 1.0000 | ORAL_TABLET | Freq: Four times a day (QID) | ORAL | 0 refills | Status: AC | PRN

## 2021-07-17 SURGICAL SUPPLY — 36 items
ADH SKN CLS APL DERMABOND .7 (GAUZE/BANDAGES/DRESSINGS) ×1
AGENT HMST KT MTR STRL THRMB (HEMOSTASIS) ×1
ARMBAND PINK RESTRICT EXTREMIT (MISCELLANEOUS) ×4 IMPLANT
BAG COUNTER SPONGE SURGICOUNT (BAG) ×2 IMPLANT
BAG SPNG CNTER NS LX DISP (BAG) ×1
CANISTER SUCT 3000ML PPV (MISCELLANEOUS) ×2 IMPLANT
CLIP TI WIDE RED SMALL 6 (CLIP) ×1 IMPLANT
CLIP VESOCCLUDE MED 6/CT (CLIP) ×2 IMPLANT
CLIP VESOCCLUDE SM WIDE 6/CT (CLIP) ×2 IMPLANT
COVER PROBE W GEL 5X96 (DRAPES) ×2 IMPLANT
COVER TRANSDUCER ULTRASND GEL (DISPOSABLE) ×1 IMPLANT
DERMABOND ADVANCED (GAUZE/BANDAGES/DRESSINGS) ×1
DERMABOND ADVANCED .7 DNX12 (GAUZE/BANDAGES/DRESSINGS) ×1 IMPLANT
ELECT REM PT RETURN 9FT ADLT (ELECTROSURGICAL) ×2
ELECTRODE REM PT RTRN 9FT ADLT (ELECTROSURGICAL) ×1 IMPLANT
GLOVE SURG SS PI 7.5 STRL IVOR (GLOVE) ×6 IMPLANT
GOWN STRL REUS W/ TWL LRG LVL3 (GOWN DISPOSABLE) ×2 IMPLANT
GOWN STRL REUS W/ TWL XL LVL3 (GOWN DISPOSABLE) ×1 IMPLANT
GOWN STRL REUS W/TWL LRG LVL3 (GOWN DISPOSABLE) ×4
GOWN STRL REUS W/TWL XL LVL3 (GOWN DISPOSABLE) ×2
HEMOSTAT SNOW SURGICEL 2X4 (HEMOSTASIS) IMPLANT
KIT BASIN OR (CUSTOM PROCEDURE TRAY) ×2 IMPLANT
KIT TURNOVER KIT B (KITS) ×2 IMPLANT
NS IRRIG 1000ML POUR BTL (IV SOLUTION) ×2 IMPLANT
PACK CV ACCESS (CUSTOM PROCEDURE TRAY) ×2 IMPLANT
PAD ARMBOARD 7.5X6 YLW CONV (MISCELLANEOUS) ×4 IMPLANT
SLING ARM FOAM STRAP LRG (SOFTGOODS) IMPLANT
SLING ARM FOAM STRAP MED (SOFTGOODS) ×1 IMPLANT
SURGIFLO W/THROMBIN 8M KIT (HEMOSTASIS) ×1 IMPLANT
SUT PROLENE 6 0 BV (SUTURE) ×2 IMPLANT
SUT VIC AB 3-0 SH 27 (SUTURE) ×6
SUT VIC AB 3-0 SH 27X BRD (SUTURE) ×1 IMPLANT
SUT VICRYL 4-0 PS2 18IN ABS (SUTURE) ×2 IMPLANT
TOWEL GREEN STERILE (TOWEL DISPOSABLE) ×2 IMPLANT
UNDERPAD 30X36 HEAVY ABSORB (UNDERPADS AND DIAPERS) ×2 IMPLANT
WATER STERILE IRR 1000ML POUR (IV SOLUTION) ×2 IMPLANT

## 2021-07-17 NOTE — Op Note (Signed)
    Patient name: Hannah [name] MRN: [id number] DOB: 07-Dec-1950 Sex: female  07/17/2021 Pre-operative Diagnosis: CKD 5 Post-operative diagnosis:  Same Surgeon:  [name] Assistants:  Laurence Slate, PA Procedure:   Left brachiocephalic fistula Blood Loss:  minimal Specimens:  none  Findings:  5 mm cephalic vein, 4.5 mm brachial artery  Indications:  This is a 71 yo female nearing dialysis.  She is here for access.  Procedure:  The patient was identified in the holding area and taken to [place] 16  The patient was then placed supine on the table. regional anesthesia was administered.  The patient was prepped and draped in the usual sterile fashion.  A time out was called and antibiotics were administered.  A PA was necessary to expedite the procedure and assist with technical details.  U/s was used to evaluate the cephalic vein.  It looked like an adequate vein measuring 4-5 mm.  A transverse incision was made just proximal to the antecubital crease.  Cautery was used divide subcutaneous tissue.  I then sharply dissected out the brachial artery.  This was a disease-free 4-5 mm artery.  It was encircled proximally and distally with Vesseloops.  Next attention was turned towards the cephalic vein.  With the PA providing excellent exposure, I dissected out the cephalic vein.  It was approximately 6 mm to the antecubital crease but then tapered to 4 mm.  It was fully mobilized.  Side branches were ligated between silk ties.  It was marked for orientation.  The vein was then ligated with a 2-0 silk tie.  It distended nicely with heparin saline.  Next, the brachial artery was occluded with vascular clamps and a #11 blade was used to make an arteriotomy.  The vein was spatulated but the size of the arteriotomy in a running anastomosis was created with 6-0 Prolene.  The PA provided exposure by following the suture.  Prior to completion, the appropriate flushing maneuvers were performed and the  anastomosis was completed.  The clamps were released.  There was an excellent thrill within the fistula and the patient had a palpable radial pulse.  I then ensured that there were no kinks within the vein and all visible branches were divided.  The wound was then irrigated.  Hemostasis was achieved.  The incision was closed with 2 layers of Vicryl followed by Dermabond.  There were no immediate complications.   Disposition: To PACU stable.   Theotis Burrow, M.D., [facility] [place] Office: [phone] Pager:  [phone]

## 2021-07-17 NOTE — Anesthesia Preprocedure Evaluation (Addendum)
Anesthesia Evaluation  Patient identified by MRN, date of birth, ID band Patient awake    Reviewed: Allergy & Precautions, NPO status , Patient's Chart, lab work & pertinent test results  History of Anesthesia Complications Negative for: history of anesthetic complications  Airway Mallampati: I  TM Distance: >3 FB Neck ROM: Full    Dental  (+) Edentulous Upper, Edentulous Lower   Pulmonary neg pulmonary ROS,    breath sounds clear to auscultation       Cardiovascular hypertension, Pt. on medications (-) angina Rhythm:Regular Rate:Normal     Neuro/Psych negative neurological ROS     GI/Hepatic negative GI ROS, Neg liver ROS,   Endo/Other  diabetes (glu 81), Insulin Dependent  Renal/GU ESRFRenal disease     Musculoskeletal   Abdominal   Peds  Hematology  (+) Blood dyscrasia (Hb 8.8, plt 105), anemia ,   Anesthesia Other Findings   Reproductive/Obstetrics                            Anesthesia Physical Anesthesia Plan  ASA: 3  Anesthesia Plan: Regional   Post-op Pain Management: Regional block*   Induction:   PONV Risk Score and Plan: 2 and Ondansetron and Treatment may vary due to age or medical condition  Airway Management Planned: Natural Airway and Simple Face Mask  Additional Equipment: None  Intra-op Plan:   Post-operative Plan:   Informed Consent: I have reviewed the patients History and Physical, chart, labs and discussed the procedure including the risks, benefits and alternatives for the proposed anesthesia with the patient or authorized representative who has indicated his/her understanding and acceptance.       Plan Discussed with: CRNA and Surgeon  Anesthesia Plan Comments: (Plan routine monitors, interscalene block with MAC)       Anesthesia Quick Evaluation

## 2021-07-17 NOTE — Discharge Instructions (Signed)
° °  [facility] [place] ° °Discharge Instructions ° °AV Fistula or Graft Surgery for Dialysis Access ° °Please refer to the following instructions for your post-procedure care. Your surgeon or physician assistant will discuss any changes with you. ° °Activity ° °You may drive the day following your surgery, if you are comfortable and no longer taking prescription pain medication. Resume full activity as the soreness in your incision resolves. ° °Bathing/Showering ° °You may shower after you go home. Keep your incision dry for 48 hours. Do not soak in a bathtub, hot tub, or swim until the incision heals completely. You may not shower if you have a hemodialysis catheter. ° °Incision Care ° °Clean your incision with mild soap and water after 48 hours. Pat the area dry with a clean towel. You do not need a bandage unless otherwise instructed. Do not apply any ointments or creams to your incision. You may have skin glue on your incision. Do not peel it off. It will come off on its own in about one week. Your arm may swell a bit after surgery. To reduce swelling use pillows to elevate your arm so it is above your heart. Your doctor will tell you if you need to lightly wrap your arm with an ACE bandage. ° °Diet ° °Resume your normal diet. There are not special food restrictions following this procedure. In order to heal from your surgery, it is CRITICAL to get adequate nutrition. Your body requires vitamins, minerals, and protein. Vegetables are the best source of vitamins and minerals. Vegetables also provide the perfect balance of protein. Processed food has little nutritional value, so try to avoid this. ° °Medications ° °Resume taking all of your medications. If your incision is causing pain, you may take over-the counter pain relievers such as acetaminophen (Tylenol). If you were prescribed a stronger pain medication, please be aware these medications can cause nausea and constipation. Prevent  nausea by taking the medication with a snack or meal. Avoid constipation by drinking plenty of fluids and eating foods with high amount of fiber, such as fruits, vegetables, and grains. Do not take Tylenol if you are taking prescription pain medications. ° ° ° ° °Follow up °Your surgeon may want to see you in the office following your access surgery. If so, this will be arranged at the time of your surgery. ° °Please call us immediately for any of the following conditions: ° °Increased pain, redness, drainage (pus) from your incision site °Fever of 101 degrees or higher °Severe or worsening pain at your incision site °Hand pain or numbness. ° °Reduce your risk of vascular disease: ° °Stop smoking. If you would like help, call [facility] at 1-800-QUIT-NOW (1-800-784-8669) or [place] at 336-586-4000 ° °Manage your cholesterol °Maintain a desired weight °Control your diabetes °Keep your blood pressure down ° °Dialysis ° °It will take several weeks to several months for your new dialysis access to be ready for use. Your surgeon will determine when it is OK to use it. Your nephrologist will continue to direct your dialysis. You can continue to use your Permcath until your new access is ready for use. ° °If you have any questions, please call the office at [phone]. ° °

## 2021-07-17 NOTE — Interval H&P Note (Signed)
History and Physical Interval Note:  07/17/2021 2:27 PM  Hannah [name]  has presented today for surgery, with the diagnosis of CKD V.  The various methods of treatment have been discussed with the patient and family. After consideration of risks, benefits and other options for treatment, the patient has consented to  Procedure(s): LEFT ARM FISTULA VS GRAFT CREATION (Left) as a surgical intervention.  The patient's history has been reviewed, patient examined, no change in status, stable for surgery.  I have reviewed the patient's chart and labs.  Questions were answered to the patient's satisfaction.     [name]

## 2021-07-17 NOTE — Progress Notes (Signed)
Went and seen patient in short stay, patient was alert and oriented and calm.

## 2021-07-17 NOTE — Anesthesia Postprocedure Evaluation (Signed)
Anesthesia Post Note  Patient: Elona Yinger  Procedure(s) Performed: LEFT ARM BRACHIOCEPHALIC  ARTERIOVENOUS FISTULA CREATION (Left: Arm Upper)     Patient location during evaluation: PACU Anesthesia Type: Regional and MAC Level of consciousness: awake and alert Pain management: pain level controlled Vital Signs Assessment: post-procedure vital signs reviewed and stable Respiratory status: spontaneous breathing and respiratory function stable Cardiovascular status: stable Postop Assessment: no apparent nausea or vomiting Anesthetic complications: no   No notable events documented.  Last Vitals:  Vitals:   07/17/21 1648 07/17/21 1817  BP: (!) 187/70 (!) 153/67  Pulse: 62 (!) 59  Resp: 16 17  Temp:  36.4 C  SpO2: 99% 94%    Last Pain:  Vitals:   07/17/21 1817  TempSrc:   PainSc: 0-No pain                 Wing Gfeller DANIEL

## 2021-07-17 NOTE — Anesthesia Procedure Notes (Signed)
Anesthesia Regional Block: Interscalene brachial plexus block   Pre-Anesthetic Checklist: , timeout performed,  Correct Patient, Correct Site, Correct Laterality,  Correct Procedure, Correct Position, site marked,  Risks and benefits discussed,  Surgical consent,  Pre-op evaluation,  At surgeon's request and post-op pain management  Laterality: Left and Upper  Prep: chloraprep       Needles:  Injection technique: Single-shot  Needle Type: Echogenic Needle     Needle Length: 9cm  Needle Gauge: 21     Additional Needles:   Procedures:,,,, ultrasound used (permanent image in chart),,    Narrative:  Start time: 07/17/2021 4:24 PM End time: 07/17/2021 4:30 PM Injection made incrementally with aspirations every 5 mL.  Performed by: Personally  Anesthesiologist: Annye Asa, MD  Additional Notes: Pt identified in Holding room.  Monitors applied. Working IV access confirmed. Sterile prep L clavicle and neck.  #21ga ECHOgenic Arrow block needle to interscalene brachial plexus with US guidance.  30cc 1.5% Lidocaine 1:200k epi injected incrementally after negative test dose.  Patient asymptomatic, VSS, no heme aspirated, tolerated well.   Jenita Seashore, MD

## 2021-07-17 NOTE — Anesthesia Procedure Notes (Signed)
Procedure Name: MAC Date/Time: 07/17/2021 5:04 PM  Performed by: Moshe Salisbury, CRNAPre-anesthesia Checklist: Patient identified, Emergency Drugs available, Suction available, Patient being monitored and Timeout performed Oxygen Delivery Method: Nasal cannula Placement Confirmation: positive ETCO2 Dental Injury: Teeth and Oropharynx as per pre-operative assessment

## 2021-07-17 NOTE — Transfer of Care (Signed)
Immediate Anesthesia Transfer of Care Note  Patient: Hannah [name]  Procedure(s) Performed: LEFT ARM BRACHIOCEPHALIC  ARTERIOVENOUS FISTULA CREATION (Left: Arm Upper)  Patient Location: PACU  Anesthesia Type:MAC combined with regional for post-op pain  Level of Consciousness: drowsy and patient cooperative  Airway & Oxygen Therapy: Patient Spontanous Breathing and Patient connected to nasal cannula oxygen  Post-op Assessment: Report given to RN and Post -op Vital signs reviewed and stable  Post vital signs: Reviewed and stable  Last Vitals:  Vitals Value Taken Time  BP 153/67 07/17/21 1817  Temp    Pulse 57 07/17/21 1818  Resp 15 07/17/21 1818  SpO2 96 % 07/17/21 1818  Vitals shown include unvalidated device data.  Last Pain:  Vitals:   07/17/21 1648  TempSrc:   PainSc: 0-No pain         Complications: No notable events documented.

## 2021-07-18 ENCOUNTER — Telehealth: Payer: Self-pay

## 2021-07-18 ENCOUNTER — Encounter (HOSPITAL_COMMUNITY): Payer: Self-pay | Admitting: Surgery

## 2021-07-18 HISTORY — DX: Essential (primary) hypertension: I10

## 2021-07-18 MED FILL — Ferumoxytol Inj 510 MG/17ML (30 MG/ML) (Elemental Fe): INTRAVENOUS | Qty: 17 | Status: AC

## 2021-07-18 NOTE — Telephone Encounter (Signed)
Pt called asking when she can return to work following her HD fistula surg on 6/14.  Returned pt's call, two identifiers used. Asked pt what she does for work. 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 Informed pt that she needed to be out of work for 2 days and no heavy lifting (> 10 lbs) for 4-6 wks. Pt confirmed understanding and stated that her working would not be impacted, she would get someone else to lift anything heavy.

## 2021-07-19 ENCOUNTER — Inpatient Hospital Stay: Payer: Medicare Other

## 2021-07-19 VITALS — BP 140/64 | HR 64 | Temp 98.0°F | Resp 18 | Ht 66.0 in | Wt 176.1 lb

## 2021-07-19 DIAGNOSIS — D631 Anemia in chronic kidney disease: Secondary | ICD-10-CM

## 2021-07-19 DIAGNOSIS — D509 Iron deficiency anemia, unspecified: Secondary | ICD-10-CM | POA: Diagnosis not present

## 2021-07-19 MED ORDER — SODIUM CHLORIDE 0.9 % IV SOLN: Freq: Once | INTRAVENOUS | Status: AC

## 2021-07-19 MED ORDER — SODIUM CHLORIDE 0.9 % IV SOLN
510.0000 mg | Freq: Once | INTRAVENOUS | Status: AC
  Administered 2021-07-19: 510 mg via INTRAVENOUS
  Filled 2021-07-19: qty 510

## 2021-07-19 NOTE — Patient Instructions (Signed)

## 2021-07-30 ENCOUNTER — Inpatient Hospital Stay: Payer: Medicare Other

## 2021-07-30 ENCOUNTER — Ambulatory Visit: Payer: Medicare Other

## 2021-07-30 ENCOUNTER — Telehealth: Payer: Self-pay

## 2021-07-30 ENCOUNTER — Other Ambulatory Visit: Payer: Self-pay | Admitting: *Deleted

## 2021-07-30 DIAGNOSIS — N185 Chronic kidney disease, stage 5: Secondary | ICD-10-CM

## 2021-08-23 ENCOUNTER — Other Ambulatory Visit: Payer: Self-pay | Admitting: Nephrology

## 2021-08-27 ENCOUNTER — Telehealth: Payer: Self-pay

## 2021-08-27 NOTE — Telephone Encounter (Signed)
Pt called requesting a call back.  Reviewed pt's chart, returned pt's call, two identifiers used. Pt stated that she needed a work note with no restrictions. Letter printed and mailed per request. Confirmed understanding.

## 2021-09-02 ENCOUNTER — Encounter (HOSPITAL_COMMUNITY): Payer: Medicare Other

## 2021-09-30 ENCOUNTER — Encounter (HOSPITAL_COMMUNITY): Payer: Medicare Other

## 2021-09-30 ENCOUNTER — Telehealth: Payer: Self-pay

## 2021-09-30 ENCOUNTER — Other Ambulatory Visit: Payer: Self-pay | Admitting: Pharmacist

## 2021-10-04 ENCOUNTER — Ambulatory Visit: Payer: Medicare Other | Admitting: Oncology

## 2021-10-04 ENCOUNTER — Other Ambulatory Visit: Payer: Medicare Other

## 2021-10-04 NOTE — Telephone Encounter (Signed)
Patient's [facility] called stating patient died today and they wanted to notify us to cancel her appts.

## 2021-10-04 DEATH — deceased

## 2021-10-10 ENCOUNTER — Encounter (HOSPITAL_COMMUNITY): Payer: Medicare Other
